# Patient Record
Sex: Male | Born: 1960 | Race: White | Hispanic: No | Marital: Married | Smoking: Never smoker
Health system: Southern US, Community
[De-identification: ages and names within clinical notes are randomized; demographics above are authoritative.]

## PROBLEM LIST (undated history)

## (undated) DIAGNOSIS — K219 Gastro-esophageal reflux disease without esophagitis: Secondary | ICD-10-CM

## (undated) DIAGNOSIS — G709 Myoneural disorder, unspecified: Secondary | ICD-10-CM

## (undated) DIAGNOSIS — G8929 Other chronic pain: Secondary | ICD-10-CM

## (undated) DIAGNOSIS — M792 Neuralgia and neuritis, unspecified: Secondary | ICD-10-CM

## (undated) DIAGNOSIS — I2510 Atherosclerotic heart disease of native coronary artery without angina pectoris: Secondary | ICD-10-CM

## (undated) DIAGNOSIS — M549 Dorsalgia, unspecified: Secondary | ICD-10-CM

## (undated) DIAGNOSIS — F419 Anxiety disorder, unspecified: Secondary | ICD-10-CM

## (undated) DIAGNOSIS — M1990 Unspecified osteoarthritis, unspecified site: Secondary | ICD-10-CM

## (undated) DIAGNOSIS — I10 Essential (primary) hypertension: Secondary | ICD-10-CM

## (undated) DIAGNOSIS — E7800 Pure hypercholesterolemia, unspecified: Secondary | ICD-10-CM

## (undated) HISTORY — PX: GANGLION CYST EXCISION: SHX1691

## (undated) HISTORY — PX: BACK SURGERY: SHX140

## (undated) HISTORY — PX: LUMBAR FUSION: SHX111

---

## 1998-09-01 ENCOUNTER — Ambulatory Visit (HOSPITAL_COMMUNITY): Admission: RE | Admit: 1998-09-01 | Discharge: 1998-09-01 | Payer: Self-pay

## 2003-12-19 ENCOUNTER — Ambulatory Visit: Payer: Self-pay | Admitting: Family Medicine

## 2003-12-23 ENCOUNTER — Ambulatory Visit: Payer: Self-pay | Admitting: Family Medicine

## 2004-03-29 ENCOUNTER — Ambulatory Visit: Payer: Self-pay | Admitting: Family Medicine

## 2004-06-01 ENCOUNTER — Ambulatory Visit: Payer: Self-pay | Admitting: Family Medicine

## 2004-08-11 ENCOUNTER — Ambulatory Visit: Payer: Self-pay | Admitting: Family Medicine

## 2004-08-11 ENCOUNTER — Ambulatory Visit (HOSPITAL_COMMUNITY): Admission: RE | Admit: 2004-08-11 | Discharge: 2004-08-11 | Payer: Self-pay | Admitting: Family Medicine

## 2004-08-24 ENCOUNTER — Encounter: Admission: RE | Admit: 2004-08-24 | Discharge: 2004-09-02 | Payer: Self-pay | Admitting: Family Medicine

## 2004-09-08 ENCOUNTER — Ambulatory Visit: Payer: Self-pay | Admitting: Family Medicine

## 2004-11-11 ENCOUNTER — Ambulatory Visit: Payer: Self-pay | Admitting: Family Medicine

## 2004-12-16 ENCOUNTER — Ambulatory Visit: Payer: Self-pay | Admitting: Family Medicine

## 2005-03-24 ENCOUNTER — Ambulatory Visit: Payer: Self-pay | Admitting: Family Medicine

## 2005-06-28 ENCOUNTER — Ambulatory Visit: Payer: Self-pay | Admitting: Family Medicine

## 2005-08-08 ENCOUNTER — Ambulatory Visit: Payer: Self-pay | Admitting: Family Medicine

## 2005-11-14 ENCOUNTER — Ambulatory Visit: Payer: Self-pay | Admitting: Family Medicine

## 2006-01-17 ENCOUNTER — Ambulatory Visit: Payer: Self-pay | Admitting: Family Medicine

## 2006-03-20 ENCOUNTER — Ambulatory Visit: Payer: Self-pay | Admitting: Family Medicine

## 2006-05-16 ENCOUNTER — Ambulatory Visit: Payer: Self-pay | Admitting: Family Medicine

## 2009-06-27 ENCOUNTER — Emergency Department (HOSPITAL_COMMUNITY): Admission: EM | Admit: 2009-06-27 | Discharge: 2009-06-27 | Payer: Self-pay | Admitting: Emergency Medicine

## 2009-06-29 ENCOUNTER — Emergency Department (HOSPITAL_COMMUNITY): Admission: EM | Admit: 2009-06-29 | Discharge: 2009-06-29 | Payer: Self-pay | Admitting: Emergency Medicine

## 2009-07-13 ENCOUNTER — Encounter: Admission: RE | Admit: 2009-07-13 | Discharge: 2009-07-13 | Payer: Self-pay | Admitting: Neurosurgery

## 2009-07-28 ENCOUNTER — Encounter: Admission: RE | Admit: 2009-07-28 | Discharge: 2009-09-04 | Payer: Self-pay | Admitting: Neurosurgery

## 2009-10-19 ENCOUNTER — Encounter
Admission: RE | Admit: 2009-10-19 | Discharge: 2009-10-19 | Payer: Self-pay | Source: Home / Self Care | Attending: Physical Medicine & Rehabilitation | Admitting: Physical Medicine & Rehabilitation

## 2010-04-19 LAB — GLUCOSE, CAPILLARY
Glucose-Capillary: 187 mg/dL — ABNORMAL HIGH (ref 70–99)
Glucose-Capillary: 246 mg/dL — ABNORMAL HIGH (ref 70–99)
Glucose-Capillary: 316 mg/dL — ABNORMAL HIGH (ref 70–99)

## 2010-04-19 LAB — URINALYSIS, ROUTINE W REFLEX MICROSCOPIC
Bilirubin Urine: NEGATIVE
Glucose, UA: >1000 mg/dL — AB
Leukocytes, UA: NEGATIVE
Nitrite: NEGATIVE
Specific Gravity, Urine: 1.03 (ref 1.005–1.030)
pH: 5.5 (ref 5.0–8.0)

## 2010-04-19 LAB — BASIC METABOLIC PANEL
BUN: 17 mg/dL (ref 6–23)
Chloride: 99 mEq/L (ref 96–112)
Glucose, Bld: 278 mg/dL — ABNORMAL HIGH (ref 70–99)
Potassium: 4 mEq/L (ref 3.5–5.1)
Sodium: 133 mEq/L — ABNORMAL LOW (ref 135–145)

## 2010-04-26 ENCOUNTER — Ambulatory Visit (INDEPENDENT_AMBULATORY_CARE_PROVIDER_SITE_OTHER): Payer: Self-pay | Admitting: Internal Medicine

## 2010-07-20 ENCOUNTER — Ambulatory Visit (INDEPENDENT_AMBULATORY_CARE_PROVIDER_SITE_OTHER): Payer: Medicare Other | Admitting: Internal Medicine

## 2010-07-20 DIAGNOSIS — Z7982 Long term (current) use of aspirin: Secondary | ICD-10-CM

## 2010-07-20 DIAGNOSIS — R1013 Epigastric pain: Secondary | ICD-10-CM

## 2010-07-20 DIAGNOSIS — R1011 Right upper quadrant pain: Secondary | ICD-10-CM

## 2010-08-10 NOTE — Consult Note (Signed)
Austin Pruitt              ACCOUNT NO.:  [id number]  MEDICAL RECORD NO.:  [id number]  LOCATION:                                 FACILITY:  PHYSICIAN:  Austin Pruitt, M.D.    DATE OF BIRTH:  May 28, 1960  DATE OF CONSULTATION:  07/20/2010 DATE OF DISCHARGE:                                CONSULTATION   REASON FOR CONSULTATION:  Abdominal pain, GERD.  HISTORY OF PRESENT ILLNESS:  Austin Pruitt is a 50 year old male referred to our office by Dr. Lysbeth Pruitt for right upper quadrant pain and nausea.  He tells me he has had this right upper quadrant pain for about a year. The pain comes and goes.  He also complains of frequent acid reflux on a daily basis.  He tells me when he lies down, he becomes queasy and then becomes nauseated.  Greasy foods will cause nausea and vomiting.  His symptoms will usually last about 3 hours.  He also complains of some epigastric pain.  He does have nausea at times after he eats.  He has undergone a HIDA scan which showed an 75% function.  He also had a negative ultrasound of the gallbladder.  He has been a diabetic since 1995.  He usually has 2 bowel movements a day.  No rectal bleeding or melena.  He denies weight loss, appetite changes, dysphagia.  There are no known allergies.  HOME MEDICATIONS: 1. Meloxicam 7.5 mg b.i.d. 2. Simvastatin 40 mg a day. 3. Metformin 500 mg 4 a day. 4. Januvia 100 mg a day. 5. Norvasc 10 mg a day. 6. Clonidine 0.1 mg a day. 7. Lisinopril 20 mg a day. 8. Glimepiride 4 mg a day. 9. Actos 30 mg in the morning. 10.Protonix 40 mg p.r.n. 11.Ambien 10 mg a day. 12.Fexofenadine 120 mg a day. 13.MVI 1 a day. 14.Fish oil 1200 mg 2 a day. 15.Aspirin 81 mg a day.  There are no known allergies.  SURGERIES:  He has had 3 lower back surgeries.  The last surgery was in 2000, and he also had a ganglion cyst removed from his left wrist.  MEDICAL HISTORY:  He has been hypertensive since 1995, diabetes type 2 since 1995, and a  history of arthritis.  FAMILY HISTORY:  His mother deceased, she underwent a CABG in the past and was a  diabetic type 2 with complications.  Father deceased from an MI.  Three sisters, two have diabetes and hypertension, one is in good health.  Three brothers, two have diabetes type 2, one is in good health.  SOCIAL HISTORY:  He is married.  He is disabled.  He does not smoke, drink, or do drugs, and he has 2 children in good health.  OBJECTIVE:  VITAL SIGNS:  His weight is 248, his height is 5 feet 11 inches, his temperature is 97.7, his blood pressure is 160/90, and his pulse is 72. HEENT:  His oral mucosa is moist.  His conjunctivae are pink.  His sclerae are anicteric. NECK:  His thyroid is normal.  There is no cervical lymphadenopathy. LUNGS:  Clear. HEART:  Regular rate and rhythm. ABDOMEN:  Soft.  Bowel sounds are positive.  No masses.  He does have some slight epigastric tenderness.  There is no right upper quadrant tenderness at this time.  ASSESSMENT:  Austin Pruitt is a 50 year old male with epigastricpain and frequent acid reflux.  He also has right upper quadrant pain.  His HIDA scan and ultrasound have been normal.  Peptic ulcer disease needs to be ruled out.  Also, the differential of this would be gastroparesis since he has been a diabetic for greater than 12 years.  Austin Pruitt's last colonoscopy was greater than 20 years ago.  He is now due for screening colonoscopy.  RECOMMENDATIONS:  We will schedule an EGD in the near future.  The risk and benefits were reviewed with him and he is agreeable.  If the EGD is normal, he may very well need a gastric emptying study.  He will take Protonix 40 mg 30 minutes before breakfast on a daily basis.  We will also schedule a screening colonoscopy.    ______________________________ Dorene Ar, NP   ______________________________ Austin Pruitt, M.D.    TS/MEDQ  D:  07/20/2010  T:  07/21/2010  Job:  [id number]  cc:   Austin Pruitt, M.D. Fax: [phone]  Electronically Signed by Dorene Ar PA on 07/22/2010 05:02:08 PM Electronically Signed by Austin Pruitt M.D. on 08/10/2010 09:04:43 AM

## 2010-08-31 MED ORDER — SODIUM CHLORIDE 0.45 % IV SOLN
Freq: Once | INTRAVENOUS | Status: AC
  Administered 2010-09-01: 12:00:00 via INTRAVENOUS

## 2010-09-01 ENCOUNTER — Encounter (HOSPITAL_COMMUNITY)
Admission: RE | Disposition: A | Discharge status: Home / Self Care | Payer: Self-pay | Source: Ambulatory Visit | Attending: Internal Medicine

## 2010-09-01 ENCOUNTER — Ambulatory Visit (HOSPITAL_COMMUNITY)
Admission: RE | Admit: 2010-09-01 | Discharge: 2010-09-01 | Disposition: A | Discharge status: Home / Self Care | Payer: Medicare Other | Source: Ambulatory Visit | Attending: Internal Medicine | Admitting: Internal Medicine

## 2010-09-01 ENCOUNTER — Encounter (HOSPITAL_COMMUNITY): Payer: Self-pay | Admitting: *Deleted

## 2010-09-01 ENCOUNTER — Encounter (INDEPENDENT_AMBULATORY_CARE_PROVIDER_SITE_OTHER): Payer: Medicare Other | Admitting: Internal Medicine

## 2010-09-01 DIAGNOSIS — R112 Nausea with vomiting, unspecified: Secondary | ICD-10-CM | POA: Insufficient documentation

## 2010-09-01 DIAGNOSIS — K259 Gastric ulcer, unspecified as acute or chronic, without hemorrhage or perforation: Secondary | ICD-10-CM | POA: Insufficient documentation

## 2010-09-01 DIAGNOSIS — K644 Residual hemorrhoidal skin tags: Secondary | ICD-10-CM | POA: Insufficient documentation

## 2010-09-01 DIAGNOSIS — Z1211 Encounter for screening for malignant neoplasm of colon: Secondary | ICD-10-CM

## 2010-09-01 DIAGNOSIS — R1013 Epigastric pain: Secondary | ICD-10-CM | POA: Insufficient documentation

## 2010-09-01 DIAGNOSIS — R1011 Right upper quadrant pain: Secondary | ICD-10-CM | POA: Insufficient documentation

## 2010-09-01 DIAGNOSIS — I10 Essential (primary) hypertension: Secondary | ICD-10-CM | POA: Insufficient documentation

## 2010-09-01 DIAGNOSIS — Z01812 Encounter for preprocedural laboratory examination: Secondary | ICD-10-CM | POA: Insufficient documentation

## 2010-09-01 DIAGNOSIS — E119 Type 2 diabetes mellitus without complications: Secondary | ICD-10-CM | POA: Insufficient documentation

## 2010-09-01 DIAGNOSIS — Z79899 Other long term (current) drug therapy: Secondary | ICD-10-CM | POA: Insufficient documentation

## 2010-09-01 DIAGNOSIS — Z7982 Long term (current) use of aspirin: Secondary | ICD-10-CM | POA: Insufficient documentation

## 2010-09-01 HISTORY — DX: Gastro-esophageal reflux disease without esophagitis: K21.9

## 2010-09-01 HISTORY — PX: COLONOSCOPY: SHX5424

## 2010-09-01 HISTORY — DX: Unspecified osteoarthritis, unspecified site: M19.90

## 2010-09-01 HISTORY — PX: ESOPHAGOGASTRODUODENOSCOPY: SHX5428

## 2010-09-01 SURGERY — EGD (ESOPHAGOGASTRODUODENOSCOPY)
Anesthesia: Moderate Sedation

## 2010-09-01 MED ORDER — PROMETHAZINE HCL 25 MG/ML IJ SOLN
INTRAMUSCULAR | Status: DC | PRN
  Administered 2010-09-01 (×2): 12.5 mg via INTRAVENOUS

## 2010-09-01 MED ORDER — STERILE WATER FOR IRRIGATION IR SOLN
Status: DC | PRN
  Administered 2010-09-01: 12:00:00

## 2010-09-01 MED ORDER — MEPERIDINE HCL 25 MG/ML IJ SOLN
INTRAMUSCULAR | Status: DC | PRN
  Administered 2010-09-01 (×2): 25 mg via INTRAVENOUS

## 2010-09-01 MED ORDER — PANTOPRAZOLE SODIUM 40 MG PO TBEC: 40.0000 mg | DELAYED_RELEASE_TABLET | Freq: Two times a day (BID) | ORAL | Status: AC

## 2010-09-01 MED ORDER — BUTAMBEN-TETRACAINE-BENZOCAINE 2-2-14 % EX AERO
INHALATION_SPRAY | CUTANEOUS | Status: DC | PRN
  Administered 2010-09-01: 2 via TOPICAL

## 2010-09-01 MED ORDER — MIDAZOLAM HCL 5 MG/5ML IJ SOLN
INTRAMUSCULAR | Status: DC | PRN
Start: 2010-09-01 — End: 2010-09-01
  Administered 2010-09-01 (×2): 3 mg via INTRAVENOUS
  Administered 2010-09-01 (×2): 2 mg via INTRAVENOUS

## 2010-09-01 NOTE — Brief Op Note (Signed)
Lab in to draw h pylori.  Message left at Dr Patty Sermons office to schedule xray.

## 2010-09-01 NOTE — Brief Op Note (Signed)
09/01/2010  1:04 PM  PATIENT:  Austin Pruitt  50 y.o. male  PRE-OPERATIVE DIAGNOSIS:  Epigastric & Right upper quad Abdominal pain, Screening  POST-OPERATIVE DIAGNOSIS:  gastric ulcer; gastric erosion, normal GE junction; limited/marginal colon prep right colon; external hemorrhoids  PROCEDURE:  Procedure(s): ESOPHAGOGASTRODUODENOSCOPY (EGD) COLONOSCOPY  SURGEON:  Surgeon(s): Malissa Hippo, MD  EGD note. Esophagus; normal mucosa and GE junction. Stomach; 7 mm triangular ulcer at gastric body along the posterior wall and single erosion next to it. Duodenum; normal bulbar and post bulbar mucosa. Colonoscopy note; Prep fair in the right colon; Normal terminal ileum. Normal clonic mucosa. Small external hemorrhoids.

## 2010-09-01 NOTE — H&P (Addendum)
Austin Pruitt is an 50 y.o. male.   Chief Complaint: Recurrent epigastric and right upper quadrant pain. Patient here for diagnostic EGD HPI: Patient is a 50 year old male with over a year history of intermittent pain epigastric region and right upper quadrant associated with frequent nausea and sporadic vomiting. He's undergone multiple studies including ultrasound and HIDA scan and they have all been negative. He denies hematemesis melena or rectal bleeding; he gives a remote history of peptic ulcer disease. He is on meloxicam chronically for chronic back pain and arthritis. He continues to experience intermittent heartburn despite taking medication.   Past Medical History  Diagnosis Date  . GERD (gastroesophageal reflux disease)   . Arthritis   . Diabetes mellitus     Past Surgical History  Procedure Date  . Back surgery     times 3  . Ganglion cyst excision   . Back surgery     History reviewed. No pertinent family history. Social History:  reports that he has never smoked. He does not have any smokeless tobacco history on file. He reports that he does not drink alcohol or use illicit drugs.  Allergies: Not on File  Medications Prior to Admission  Medication Dose Route Frequency Provider Last Rate Last Dose  . 0.45 % sodium chloride infusion   Intravenous Once Malissa Hippo, MD 20 mL/hr at 09/01/10 1201    . simethicone susp 15 ml in sterile water 1000 ml irrigation    PRN Malissa Hippo, MD       Medications Prior to Admission  Medication Sig Dispense Refill  . amLODipine (NORVASC) 10 MG tablet Take 10 mg by mouth daily.        Marland Kitchen aspirin 81 MG tablet Take 81 mg by mouth daily.        Marland Kitchen atorvastatin (LIPITOR) 40 MG tablet Take 40 mg by mouth daily.        . cloNIDine (CATAPRES) 0.1 MG tablet Take 0.1 mg by mouth 2 (two) times daily.        . fish oil-omega-3 fatty acids 1000 MG capsule Take 2 g by mouth daily.        Marland Kitchen glimepiride (AMARYL) 4 MG tablet Take 4 mg by  mouth daily before breakfast.        . HYDROcodone-acetaminophen (NORCO) 5-325 MG per tablet Take 1 tablet by mouth every 6 (six) hours as needed.        Marland Kitchen lisinopril (PRINIVIL,ZESTRIL) 20 MG tablet Take 20 mg by mouth daily.        . meloxicam (MOBIC) 7.5 MG tablet Take 7.5 mg by mouth daily.        . metFORMIN (GLUCOPHAGE-XR) 500 MG 24 hr tablet Take 500 mg by mouth daily with breakfast.        . Multiple Vitamins-Minerals (ANTIOXIDANT FORMULA SG) capsule Take 1 capsule by mouth daily.        . pantoprazole (PROTONIX) 40 MG tablet Take 40 mg by mouth daily.        . sitaGLIPtin (JANUVIA) 100 MG tablet Take 100 mg by mouth daily.        Marland Kitchen zolpidem (AMBIEN CR) 12.5 MG CR tablet Take 12.5 mg by mouth.          Results for orders placed during the hospital encounter of 09/01/10 (from the past 48 hour(s))  GLUCOSE, CAPILLARY     Status: Abnormal   Collection Time   09/01/10 11:43 AM      Component  Value Range Comment   Glucose-Capillary 154 (*) 70 - 99 (mg/dL)    No results found.  Review of Systems  Constitutional: Negative.   Gastrointestinal: Positive for nausea and abdominal pain. Negative for diarrhea, constipation, blood in stool and melena. Heartburn: intermittent despite taking protonix. Vomiting: occ vomiting.    Blood pressure 174/103, pulse 66, temperature 98.5 F (36.9 C), temperature source Oral, resp. rate 16, height 5\' 11"  (1.803 m), weight 248 lb (112.492 kg), SpO2 95.00%. Physical Exam  Constitutional: He appears well-developed and well-nourished.  HENT:  Mouth/Throat: Oropharynx is clear and moist.  Eyes: Conjunctivae are normal. No scleral icterus.  Neck: Neck supple. No thyromegaly present.  Cardiovascular: Normal rate, regular rhythm and normal heart sounds.   No murmur heard. Respiratory: Breath sounds normal.  GI: Soft. There is Tenderness: at midepigastric area and at RUQ.Marland Kitchen There is no rebound and no guarding.  Musculoskeletal: He exhibits no edema.    Lymphadenopathy:    He has no cervical adenopathy.  Neurological: He is alert.  Skin: Skin is warm and dry.     Assessment/Plan Recurrent epigastric and right upper quadrant pain with nausea and sporadic vomiting. Chronic GERD. Diagnostic EGD looking for peptic ulcer disease.  Salmaan Patchin U 09/01/2010, 12:15 PM   Patient will also undergo screening colonoscopy; these note that he will be 50 in 9 days.

## 2010-09-01 NOTE — Op Note (Signed)
NAME:  Austin Pruitt, Austin Pruitt              ACCOUNT NO.:  [id number]  MEDICAL RECORD NO.:  [id number]  LOCATION:  [facility]                          FACILITY:  [facility]  PHYSICIAN:  Lionel December, M.D.    DATE OF BIRTH:  [birth year]  DATE OF PROCEDURE:  09/01/2010 DATE OF DISCHARGE:  09/01/2010                              OPERATIVE REPORT   PROCEDURE:  Esophagogastroduodenoscopy followed by colonoscopy.  INDICATION:  Austin Pruitt is 50 year old Caucasian male with recurrent epigastric and right upper quadrant pain associated with nausea and sporadic vomiting spells.  He has chronic GERD and maintained on PPI and his heartburn is not well controlled.  The patient is on meloxicam daily and he also takes baby aspirin.  He has undergone ultrasound and HIDA scan and these studies were unremarkable.  He is undergoing diagnostic EGD followed by average risk screening colonoscopy.  Please note that he will be 50 years old in couple of days.  Procedure risks were reviewed with the patient.  Informed consent was obtained.  MEDS FOR CONSCIOUS SEDATION:  Cetacaine spray for oropharyngeal topical anesthesia, Demerol 50 mg IV, Versed 10 mg IV, promethazine 25 mg IV in diluted form.  FINDINGS:  Procedure performed in endoscopy suite.  The patient's vital signs and O2 sat were monitored during the procedure and remained stable.  PROCEDURE: 1. Esophagogastroduodenoscopy.  The patient was placed in left lateral     recumbent position and Pentax videoscope was passed through     oropharynx without any difficulty into esophagus. 2. Esophagus.  Mucosa of the esophagus was normal.  GE junction was     located at 40 cm from the incisors and was unremarkable. 3. Stomach.  It was empty and distended very well by insufflation.     Folds of proximal stomach are normal.  In the proximal stomach, few     centimeters distal to GE junction was a triangular ulcer about 7 mm     in maximal diameter and there was a linear erosion  next to it.  At     a clean base, there was prepyloric erythema, but no other lesions     were noted.  Pyloric channel was patent.  Angularis, fundus, and     cardia were examined by retroflexion of scope and no other     abnormalities were noted. 4. Duodenum.  Bulbar mucosa was normal.  Scope was passed into second     part of duodenal mucosa and folds were normal.  Endoscope was     withdrawn and the patient prepared for procedure #2. 5. Colonoscopy.  Rectal examination performed.  No abnormality noted     on external or digital exam.  Pentax videoscope was placed through     rectum and advanced under vision into sigmoid colon and beyond.     This preparation was satisfactory except he had pasty stool coating     mucosa of the ascending colon and cecum.  Therefore, examination of     this segment was not optimal.  Cecal landmarks are well seen and     pictures taken for the record.  Short segment of GI was also     examined and was  normal.  As the scope was withdrawn, colonic     mucosa was carefully examined.  No polyps, tumor masses, or     diverticular changes were noted.  Rectal mucosa was normal.  Scope     was retroflexed to examine anorectal junction and small hemorrhoids     noted below the dentate line.  Endoscope was withdrawn.  Withdrawal     time was 10 minutes.  The patient tolerated the procedure well.  FINAL DIAGNOSES: 1. Small ulcer at gastric body along the posterior wall most likely     secondary to nonsteroidal antiinflammatory drug therapy.  We will     rule out H. pylori infection. 2. Normal terminal ileum. 3. Normal colonoscopy except external hemorrhoids, however,     examination of the ascending colon was suboptimal because of     coating with pasty stool. 4. I am not convinced that this small gastric ulcer would explain all     of patient's symptoms, it might explain his midepigastric pain and     nausea.  I am still concerned that he could have biliary  tract     disease.  RECOMMENDATIONS: 1. Increase Protonix to 40 mg twice daily prescription written for 60     with 5 refills. 2. The patient advised to take meloxicam only when absolutely     necessary. 3. We will check his H. pylori serology today. 4. We will schedule him for upper abdominal ultrasound.          ______________________________ Lionel December, M.D.     NR/MEDQ  D:  09/01/2010  T:  09/01/2010  Job:  [id number]  cc:   Delaney Meigs, M.D. Fax: [phone]

## 2010-09-02 LAB — H. PYLORI ANTIBODY, IGG: H Pylori IgG: 0.5 {ISR}

## 2010-09-07 ENCOUNTER — Ambulatory Visit (HOSPITAL_COMMUNITY)
Admission: RE | Admit: 2010-09-07 | Discharge: 2010-09-07 | Disposition: A | Discharge status: Home / Self Care | Payer: Medicare Other | Source: Ambulatory Visit | Attending: Internal Medicine | Admitting: Internal Medicine

## 2010-09-07 DIAGNOSIS — K7689 Other specified diseases of liver: Secondary | ICD-10-CM | POA: Insufficient documentation

## 2010-09-07 DIAGNOSIS — R932 Abnormal findings on diagnostic imaging of liver and biliary tract: Secondary | ICD-10-CM | POA: Insufficient documentation

## 2010-09-07 DIAGNOSIS — R1011 Right upper quadrant pain: Secondary | ICD-10-CM | POA: Insufficient documentation

## 2010-09-08 ENCOUNTER — Encounter (HOSPITAL_COMMUNITY): Payer: Self-pay | Admitting: Internal Medicine

## 2010-09-08 ENCOUNTER — Other Ambulatory Visit (INDEPENDENT_AMBULATORY_CARE_PROVIDER_SITE_OTHER): Payer: Self-pay | Admitting: Internal Medicine

## 2010-09-08 DIAGNOSIS — R109 Unspecified abdominal pain: Secondary | ICD-10-CM

## 2010-10-05 NOTE — H&P (Signed)
NT[facility] SOAP Note  Vital Signs:  Vitals as of: 10/05/2010: Systolic 142: Diastolic 90: Heart Rate 75: Temp 98.63F: Height 76ft 11in: Weight 246Lbs 0 Ounces: Pain Level 6: BMI 34  BMI : 34.31 kg/m2  Subjective: This 50 Years 1 Months old Male presents for of ABDOMINAL PAIN : ,Has been having intermittent right upper quadrant abdominal pain, nausea, and fatty food intolerance for some time now. No fever, chills, jaundice. Workup by Dr. Karilyn Cota reveals cholelithiasis. HIDA scan reproduced his symptoms.  Review of Symptoms:  Constitutional: unremarkable  headaches Eyes:unremarkable  Nose/Mouth/Throat:unremarkable  Cardiovascular:unremarkable  Respiratory: unremarkable  Gastrointestin abdominal pain,nausea,vomiting,heartburn ?kidney stones joint pain Skin: unremarkable  Hematolgic/Lymphatic: unremarkable  Allergic/Immunologic:unremarkable     Past Medical History:Reviewed  Past Medical History  Surgical History: back surgery x 3 Medical Problems: Diabetes, High Blood pressure, high cholesterol Allergies: nkda Medications: atorvastatin, losartan, metformin, amlodipine, janumet, lisinopril, clonidine, meloxicam, glipuride, omeprazole, zolpidem, lorazepam, baby asa   Social History: Reviewed   Social History  Preferred Language: English ([place]) Race: White Ethnicity: Not Hispanic / Latino Age: 50 Years 1 Months Marital Status: M Alcohol: unknown Recreational drug(s): No   Smoking Status: Smoker/Current status unknown reviewed on 10/05/2010  Family History: Reviewed  Family History  Is there a family history of:dm, cad, htn   Objective Information:  General: Well appearing, well nourished in no distress.  Skin:no rash or prominent lesions  Head: Atraumatic; no masses; no abnormalities  Neck: Supple without lymphadenopathy.  Heart: RRR, no murmur or gallop. Normal S1, S2. No S3, S4.  Lungs:CTA bilaterally, no wheezes, rhonchi, rales. Breathing unlabored.    Abdomen: Soft, ND, normal bowel sounds, no HSM, no masses. No peritoneal signs. Tender in right upper quadrant to palpation.   Assessment: cholecystitis, cholelithiasis  Diagnosis & Procedure: DiagnosisCode: 574.10, ProcedureCode: 82956,   Orders:     Plan:Scheduled for laparoscopic cholecystectomy for 10/08/10.    Patient Education: Alternative treatments to surgery were discussed with patient (and family).Risks and benefits of procedure were fully explained to the patient (and family) who gave informed consent. Patient/family questions were addressed.  Follow-up: Pending Surgery

## 2010-10-06 NOTE — Patient Instructions (Signed)
20 Austin Pruitt  10/06/2010   Your procedure is scheduled on: 10/08/10  Report to [facility] at 0700 AM.  Call this number if you have problems the morning of surgery: [phone]   Remember:   Do not eat food:After Midnight.  Do not drink clear liquids: After Midnight.  Take these medicines the morning of surgery with A SIP OF WATER: amlodipine, catapres, lisinopril, cozaar, protonix. Do not take any diabetic medicines the morning of your procedure.   Do not wear jewelry, make-up or nail polish.  Do not wear lotions, powders, or perfumes. You may wear deodorant.  Do not shave 48 hours prior to surgery.  Do not bring valuables to the hospital.  Contacts, dentures or bridgework may not be worn into surgery.  Leave suitcase in the car. After surgery it may be brought to your room.  For patients admitted to the hospital, checkout time is 11:00 AM the day of discharge.   Patients discharged the day of surgery will not be allowed to drive home.  Name and phone number of your driver: family  Special Instructions: CHG Shower Use Special Wash: 1/2 bottle night before surgery and 1/2 bottle morning of surgery.   Please read over the following fact sheets that you were given: Coughing and Deep Breathing, MRSA Information, Surgical Site Infection Prevention, Anesthesia Post-op Instructions and Care and Recovery After Surgery   PATIENT INSTRUCTIONS POST-ANESTHESIA  IMMEDIATELY FOLLOWING SURGERY:  Do not drive or operate machinery for the first twenty four hours after surgery.  Do not make any important decisions for twenty four hours after surgery or while taking narcotic pain medications or sedatives.  If you develop intractable nausea and vomiting or a severe headache please notify your doctor immediately.  FOLLOW-UP:  Please make an appointment with your surgeon as instructed. You do not need to follow up with anesthesia unless specifically instructed to do so.  WOUND CARE INSTRUCTIONS (if  applicable):  Keep a dry clean dressing on the anesthesia/puncture wound site if there is drainage.  Once the wound has quit draining you may leave it open to air.  Generally you should leave the bandage intact for twenty four hours unless there is drainage.  If the epidural site drains for more than 36-48 hours please call the anesthesia department.  QUESTIONS?:  Please feel free to call your physician or the hospital operator if you have any questions, and they will be happy to assist you.     [facility] Anesthesia Department [address] [place] [phone]

## 2010-10-07 ENCOUNTER — Other Ambulatory Visit: Payer: Self-pay

## 2010-10-07 ENCOUNTER — Encounter (HOSPITAL_COMMUNITY)
Admission: RE | Admit: 2010-10-07 | Discharge: 2010-10-07 | Disposition: A | Discharge status: Home / Self Care | Payer: Medicare Other | Source: Ambulatory Visit | Attending: General Surgery | Admitting: General Surgery

## 2010-10-07 ENCOUNTER — Encounter (HOSPITAL_COMMUNITY): Payer: Self-pay

## 2010-10-07 HISTORY — DX: Atherosclerotic heart disease of native coronary artery without angina pectoris: I25.10

## 2010-10-07 HISTORY — DX: Pure hypercholesterolemia, unspecified: E78.00

## 2010-10-07 HISTORY — DX: Dorsalgia, unspecified: M54.9

## 2010-10-07 HISTORY — DX: Other chronic pain: G89.29

## 2010-10-07 HISTORY — DX: Myoneural disorder, unspecified: G70.9

## 2010-10-07 HISTORY — DX: Neuralgia and neuritis, unspecified: M79.2

## 2010-10-07 HISTORY — DX: Anxiety disorder, unspecified: F41.9

## 2010-10-07 HISTORY — DX: Essential (primary) hypertension: I10

## 2010-10-07 LAB — DIFFERENTIAL
Basophils Absolute: 0 10*3/uL (ref 0.0–0.1)
Basophils Relative: 0 % (ref 0–1)
Eosinophils Absolute: 0.1 10*3/uL (ref 0.0–0.7)
Monocytes Relative: 9 % (ref 3–12)
Neutro Abs: 4.4 10*3/uL (ref 1.7–7.7)
Neutrophils Relative %: 65 % (ref 43–77)

## 2010-10-07 LAB — CBC
Hemoglobin: 15.1 g/dL (ref 13.0–17.0)
MCH: 29.2 pg (ref 26.0–34.0)
MCHC: 35.5 g/dL (ref 30.0–36.0)
RDW: 13 % (ref 11.5–15.5)

## 2010-10-07 LAB — BASIC METABOLIC PANEL
BUN: 13 mg/dL (ref 6–23)
Calcium: 9.7 mg/dL (ref 8.4–10.5)
Creatinine, Ser: 0.61 mg/dL (ref 0.50–1.35)
GFR calc Af Amer: >60 mL/min (ref >60)
GFR calc non Af Amer: >60 mL/min (ref >60)
Glucose, Bld: 244 mg/dL — ABNORMAL HIGH (ref 70–99)
Potassium: 4.2 mEq/L (ref 3.5–5.1)

## 2010-10-07 LAB — HEPATIC FUNCTION PANEL
AST: 45 U/L — ABNORMAL HIGH (ref 0–37)
Albumin: 4.2 g/dL (ref 3.5–5.2)
Bilirubin, Direct: 0.2 mg/dL (ref 0.0–0.3)
Total Bilirubin: 0.6 mg/dL (ref 0.3–1.2)

## 2010-10-07 LAB — SURGICAL PCR SCREEN: MRSA, PCR: NEGATIVE

## 2010-10-08 ENCOUNTER — Encounter (HOSPITAL_COMMUNITY): Payer: Self-pay

## 2010-10-08 ENCOUNTER — Ambulatory Visit (HOSPITAL_COMMUNITY): Payer: Medicare Other | Admitting: Anesthesiology

## 2010-10-08 ENCOUNTER — Encounter (HOSPITAL_COMMUNITY): Payer: Self-pay | Admitting: Anesthesiology

## 2010-10-08 ENCOUNTER — Other Ambulatory Visit: Payer: Self-pay | Admitting: General Surgery

## 2010-10-08 ENCOUNTER — Encounter (HOSPITAL_COMMUNITY)
Admission: RE | Disposition: A | Discharge status: Home / Self Care | Payer: Self-pay | Source: Ambulatory Visit | Attending: General Surgery

## 2010-10-08 ENCOUNTER — Ambulatory Visit (HOSPITAL_COMMUNITY)
Admission: RE | Admit: 2010-10-08 | Discharge: 2010-10-08 | Disposition: A | Discharge status: Home / Self Care | Payer: Medicare Other | Source: Ambulatory Visit | Attending: General Surgery | Admitting: General Surgery

## 2010-10-08 DIAGNOSIS — K8010 Calculus of gallbladder with chronic cholecystitis without obstruction: Secondary | ICD-10-CM | POA: Insufficient documentation

## 2010-10-08 DIAGNOSIS — Z01812 Encounter for preprocedural laboratory examination: Secondary | ICD-10-CM | POA: Insufficient documentation

## 2010-10-08 DIAGNOSIS — Z79899 Other long term (current) drug therapy: Secondary | ICD-10-CM | POA: Insufficient documentation

## 2010-10-08 DIAGNOSIS — I10 Essential (primary) hypertension: Secondary | ICD-10-CM | POA: Insufficient documentation

## 2010-10-08 DIAGNOSIS — Z01810 Encounter for preprocedural cardiovascular examination: Secondary | ICD-10-CM | POA: Insufficient documentation

## 2010-10-08 DIAGNOSIS — Z7982 Long term (current) use of aspirin: Secondary | ICD-10-CM | POA: Insufficient documentation

## 2010-10-08 DIAGNOSIS — E119 Type 2 diabetes mellitus without complications: Secondary | ICD-10-CM | POA: Insufficient documentation

## 2010-10-08 HISTORY — PX: CHOLECYSTECTOMY: SHX55

## 2010-10-08 LAB — GLUCOSE, CAPILLARY: Glucose-Capillary: 287 mg/dL — ABNORMAL HIGH (ref 70–99)

## 2010-10-08 SURGERY — LAPAROSCOPIC CHOLECYSTECTOMY
Anesthesia: General | Wound class: Contaminated

## 2010-10-08 MED ORDER — ONDANSETRON HCL 4 MG/2ML IJ SOLN: 4.0000 mg | Freq: Once | INTRAMUSCULAR | Status: DC | PRN

## 2010-10-08 MED ORDER — ROCURONIUM BROMIDE 50 MG/5ML IV SOLN
INTRAVENOUS | Status: AC
  Filled 2010-10-08: qty 1

## 2010-10-08 MED ORDER — ACETAMINOPHEN 325 MG PO TABS: 325.0000 mg | ORAL_TABLET | ORAL | Status: DC | PRN

## 2010-10-08 MED ORDER — HEMOSTATIC AGENTS (NO CHARGE) OPTIME
TOPICAL | Status: DC | PRN
  Administered 2010-10-08: 2 via TOPICAL

## 2010-10-08 MED ORDER — ONDANSETRON HCL 4 MG/2ML IJ SOLN
INTRAMUSCULAR | Status: AC
  Filled 2010-10-08: qty 2

## 2010-10-08 MED ORDER — KETOROLAC TROMETHAMINE 30 MG/ML IJ SOLN
INTRAMUSCULAR | Status: AC
  Filled 2010-10-08: qty 1

## 2010-10-08 MED ORDER — ONDANSETRON HCL 4 MG/2ML IJ SOLN
4.0000 mg | Freq: Once | INTRAMUSCULAR | Status: AC
  Administered 2010-10-08: 4 mg via INTRAVENOUS

## 2010-10-08 MED ORDER — LACTATED RINGERS IV SOLN: INTRAVENOUS | Status: DC

## 2010-10-08 MED ORDER — SODIUM CHLORIDE 0.9 % IR SOLN
Status: DC | PRN
  Administered 2010-10-08: 1000 mL

## 2010-10-08 MED ORDER — NEOSTIGMINE METHYLSULFATE 1 MG/ML IJ SOLN
INTRAMUSCULAR | Status: AC
  Filled 2010-10-08: qty 10

## 2010-10-08 MED ORDER — ATROPINE SULFATE 0.4 MG/ML IJ SOLN
INTRAMUSCULAR | Status: DC | PRN
  Administered 2010-10-08: .2 mg via INTRAVENOUS

## 2010-10-08 MED ORDER — SUCCINYLCHOLINE CHLORIDE 20 MG/ML IJ SOLN
INTRAMUSCULAR | Status: DC | PRN
  Administered 2010-10-08: 120 mg via INTRAVENOUS

## 2010-10-08 MED ORDER — ENOXAPARIN SODIUM 40 MG/0.4ML ~~LOC~~ SOLN
40.0000 mg | Freq: Once | SUBCUTANEOUS | Status: AC
  Administered 2010-10-08: 40 mg via SUBCUTANEOUS

## 2010-10-08 MED ORDER — ROCURONIUM BROMIDE 100 MG/10ML IV SOLN
INTRAVENOUS | Status: DC | PRN
  Administered 2010-10-08: 25 mg via INTRAVENOUS
  Administered 2010-10-08: 10 mg via INTRAVENOUS
  Administered 2010-10-08: 5 mg via INTRAVENOUS

## 2010-10-08 MED ORDER — MIDAZOLAM HCL 2 MG/2ML IJ SOLN
INTRAMUSCULAR | Status: AC
  Filled 2010-10-08: qty 2

## 2010-10-08 MED ORDER — GLYCOPYRROLATE 0.2 MG/ML IJ SOLN
INTRAMUSCULAR | Status: AC
  Filled 2010-10-08: qty 1

## 2010-10-08 MED ORDER — FENTANYL CITRATE 0.05 MG/ML IJ SOLN
INTRAMUSCULAR | Status: AC
  Administered 2010-10-08: 50 ug via INTRAVENOUS
  Filled 2010-10-08: qty 5

## 2010-10-08 MED ORDER — LIDOCAINE HCL 1 % IJ SOLN
INTRAMUSCULAR | Status: DC | PRN
  Administered 2010-10-08: 10 mg via INTRADERMAL

## 2010-10-08 MED ORDER — SUCCINYLCHOLINE CHLORIDE 20 MG/ML IJ SOLN
INTRAMUSCULAR | Status: AC
  Filled 2010-10-08: qty 1

## 2010-10-08 MED ORDER — FENTANYL CITRATE 0.05 MG/ML IJ SOLN
INTRAMUSCULAR | Status: DC | PRN
  Administered 2010-10-08: 25 ug via INTRAVENOUS
  Administered 2010-10-08 (×2): 50 ug via INTRAVENOUS
  Administered 2010-10-08: 25 ug via INTRAVENOUS
  Administered 2010-10-08: 50 ug via INTRAVENOUS
  Administered 2010-10-08: 100 ug via INTRAVENOUS

## 2010-10-08 MED ORDER — ENOXAPARIN SODIUM 40 MG/0.4ML ~~LOC~~ SOLN
SUBCUTANEOUS | Status: AC
  Filled 2010-10-08: qty 0.4

## 2010-10-08 MED ORDER — BUPIVACAINE HCL 0.5 % IJ SOLN
INTRAMUSCULAR | Status: DC | PRN
  Administered 2010-10-08: 8 mL

## 2010-10-08 MED ORDER — FENTANYL CITRATE 0.05 MG/ML IJ SOLN
INTRAMUSCULAR | Status: AC
  Filled 2010-10-08: qty 2

## 2010-10-08 MED ORDER — PROPOFOL 10 MG/ML IV EMUL
INTRAVENOUS | Status: DC | PRN
  Administered 2010-10-08: 150 mg via INTRAVENOUS
  Administered 2010-10-08: 30 mg via INTRAVENOUS
  Administered 2010-10-08: 20 mg via INTRAVENOUS

## 2010-10-08 MED ORDER — BUPIVACAINE HCL (PF) 0.5 % IJ SOLN
INTRAMUSCULAR | Status: AC
  Filled 2010-10-08: qty 30

## 2010-10-08 MED ORDER — MIDAZOLAM HCL 2 MG/2ML IJ SOLN
1.0000 mg | INTRAMUSCULAR | Status: DC | PRN
  Administered 2010-10-08 (×2): 2 mg via INTRAVENOUS

## 2010-10-08 MED ORDER — PROPOFOL 10 MG/ML IV EMUL
INTRAVENOUS | Status: AC
  Filled 2010-10-08: qty 20

## 2010-10-08 MED ORDER — LACTATED RINGERS IV SOLN
INTRAVENOUS | Status: DC
  Administered 2010-10-08: 08:00:00 via INTRAVENOUS

## 2010-10-08 MED ORDER — ATROPINE SULFATE 0.4 MG/ML IJ SOLN
INTRAMUSCULAR | Status: AC
  Filled 2010-10-08: qty 1

## 2010-10-08 MED ORDER — HYDROCODONE-ACETAMINOPHEN 5-325 MG PO TABS: 1.0000 | ORAL_TABLET | ORAL | Status: AC | PRN

## 2010-10-08 MED ORDER — FENTANYL CITRATE 0.05 MG/ML IJ SOLN
25.0000 ug | INTRAMUSCULAR | Status: DC | PRN
  Administered 2010-10-08 (×4): 50 ug via INTRAVENOUS

## 2010-10-08 MED ORDER — KETOROLAC TROMETHAMINE 30 MG/ML IJ SOLN
30.0000 mg | Freq: Once | INTRAMUSCULAR | Status: AC
  Administered 2010-10-08: 30 mg via INTRAVENOUS

## 2010-10-08 MED ORDER — CEFAZOLIN SODIUM-DEXTROSE 2-3 GM-% IV SOLR
2.0000 g | INTRAVENOUS | Status: AC
  Administered 2010-10-08: 2 g via INTRAVENOUS

## 2010-10-08 MED ORDER — CEFAZOLIN SODIUM 1-5 GM-% IV SOLN
INTRAVENOUS | Status: AC
  Filled 2010-10-08: qty 50

## 2010-10-08 MED ORDER — GLYCOPYRROLATE 0.2 MG/ML IJ SOLN
INTRAMUSCULAR | Status: DC | PRN
  Administered 2010-10-08: .4 mg via INTRAVENOUS

## 2010-10-08 MED ORDER — NEOSTIGMINE METHYLSULFATE 1 MG/ML IJ SOLN
INTRAMUSCULAR | Status: DC | PRN
  Administered 2010-10-08: 2 mg via INTRAMUSCULAR

## 2010-10-08 MED ORDER — CEFAZOLIN SODIUM 1-5 GM-% IV SOLN
INTRAVENOUS | Status: AC
  Filled 2010-10-08: qty 100

## 2010-10-08 MED ORDER — GLYCOPYRROLATE 0.2 MG/ML IJ SOLN
0.2000 mg | Freq: Once | INTRAMUSCULAR | Status: AC | PRN
  Administered 2010-10-08: 0.2 mg via INTRAVENOUS

## 2010-10-08 SURGICAL SUPPLY — 36 items
APPLIER CLIP ROT 10 11.4 M/L (STAPLE) ×2
BAG HAMPER (MISCELLANEOUS) ×2 IMPLANT
CLIP APPLIE ROT 10 11.4 M/L (STAPLE) ×1 IMPLANT
CLOTH BEACON ORANGE TIMEOUT ST (SAFETY) ×2 IMPLANT
COVER LIGHT HANDLE STERIS (MISCELLANEOUS) ×4 IMPLANT
DECANTER SPIKE VIAL GLASS SM (MISCELLANEOUS) ×2 IMPLANT
DURAPREP 26ML APPLICATOR (WOUND CARE) ×2 IMPLANT
ELECT REM PT RETURN 9FT ADLT (ELECTROSURGICAL) ×2
ELECTRODE REM PT RTRN 9FT ADLT (ELECTROSURGICAL) ×1 IMPLANT
FILTER SMOKE EVAC LAPAROSHD (FILTER) ×2 IMPLANT
FORMALIN 10 PREFIL 120ML (MISCELLANEOUS) ×2 IMPLANT
GLOVE BIO SURGEON STRL SZ7.5 (GLOVE) ×2 IMPLANT
GLOVE BIOGEL PI IND STRL 8.5 (GLOVE) ×1 IMPLANT
GLOVE BIOGEL PI INDICATOR 8.5 (GLOVE) ×1
GLOVE ECLIPSE 7.0 STRL STRAW (GLOVE) ×2 IMPLANT
GLOVE ECLIPSE 8.0 STRL XLNG CF (GLOVE) ×2 IMPLANT
GLOVE ECLIPSE 8.5 STRL (GLOVE) ×2 IMPLANT
GLOVE INDICATOR 7.0 STRL GRN (GLOVE) ×2 IMPLANT
GOWN BRE IMP SLV AUR XL STRL (GOWN DISPOSABLE) ×4 IMPLANT
GOWN STRL REIN 3XL LVL4 (GOWN DISPOSABLE) ×2 IMPLANT
HEMOSTAT SNOW SURGICEL 2X4 (HEMOSTASIS) ×4 IMPLANT
INST SET LAPROSCOPIC AP (KITS) ×2 IMPLANT
KIT ROOM TURNOVER APOR (KITS) ×2 IMPLANT
KIT TROCAR LAP CHOLE (TROCAR) ×2 IMPLANT
MANIFOLD NEPTUNE II (INSTRUMENTS) ×2 IMPLANT
NS IRRIG 1000ML POUR BTL (IV SOLUTION) ×2 IMPLANT
PACK LAP CHOLE LZT030E (CUSTOM PROCEDURE TRAY) ×2 IMPLANT
PAD ARMBOARD 7.5X6 YLW CONV (MISCELLANEOUS) ×2 IMPLANT
POUCH SPECIMEN RETRIEVAL 10MM (ENDOMECHANICALS) ×2 IMPLANT
SET BASIN LINEN APH (SET/KITS/TRAYS/PACK) ×2 IMPLANT
SPONGE GAUZE 2X2 8PLY STRL LF (GAUZE/BANDAGES/DRESSINGS) ×4 IMPLANT
STAPLER VISISTAT (STAPLE) ×2 IMPLANT
SUT VICRYL 0 UR6 27IN ABS (SUTURE) ×2 IMPLANT
TAPE CLOTH SURG 4X10 WHT LF (GAUZE/BANDAGES/DRESSINGS) ×4 IMPLANT
WARMER LAPAROSCOPE (MISCELLANEOUS) ×2 IMPLANT
YANKAUER SUCT 12FT TUBE ARGYLE (SUCTIONS) ×2 IMPLANT

## 2010-10-08 NOTE — Anesthesia Postprocedure Evaluation (Signed)
Anesthesia Post Note  Patient: Austin Pruitt  Procedure(s) Performed:  LAPAROSCOPIC CHOLECYSTECTOMY  Anesthesia type: General  Patient location: PACU  Post pain: Pain level controlled  Post assessment: Post-op Vital signs reviewed, Patient's Cardiovascular Status Stable, Respiratory Function Stable, Patent Airway, No signs of Nausea or vomiting and Pain level controlled  Last Vitals:  Filed Vitals:   10/08/10 1127  BP: 122/71  Pulse: 69  Temp: 97.9 F (36.6 C)  Resp: 18    Post vital signs: Reviewed and stable  Level of consciousness: awake and alert   Complications: No apparent anesthesia complications  D/C home

## 2010-10-08 NOTE — Anesthesia Procedure Notes (Signed)
Procedure Name: Intubation Date/Time: 10/08/2010 9:24 AM Performed by: Minerva Areola Pre-anesthesia Checklist: Patient identified, Patient being monitored, Timeout performed, Emergency Drugs available and Suction available Patient Re-evaluated:Patient Re-evaluated prior to inductionOxygen Delivery Method: Circle System Utilized Preoxygenation: Pre-oxygenation with 100% oxygen Intubation Type: Rapid sequence and Circoid Pressure applied Laryngoscope Size: Miller and 2 Grade View: Grade I Tube type: Oral Tube size: 7.0 mm Number of attempts: 1 Airway Equipment and Method: stylet Placement Confirmation: ETT inserted through vocal cords under direct vision,  positive ETCO2 and breath sounds checked- equal and bilateral Secured at: 21 cm Tube secured with: Tape Dental Injury: Teeth and Oropharynx as per pre-operative assessment

## 2010-10-08 NOTE — Interval H&P Note (Signed)
History and Physical Interval Note:   10/08/2010   8:55 AM   Austin Pruitt  has presented today for surgery, with the diagnosis of Cholecystitis with cholelithiasis [574.10]  The various methods of treatment have been discussed with the patient and family. After consideration of risks, benefits and other options for treatment, the patient has consented to  Procedure(s): LAPAROSCOPIC CHOLECYSTECTOMY as a surgical intervention .  I have reviewed the patients' chart and labs.  Questions were answered to the patient's satisfaction.     Dalia Heading  MD

## 2010-10-08 NOTE — Transfer of Care (Signed)
Immediate Anesthesia Transfer of Care Note  Patient: Austin Pruitt  Procedure(s) Performed:  LAPAROSCOPIC CHOLECYSTECTOMY  Patient Location: PACU  Anesthesia Type: General  Level of Consciousness: awake  Airway & Oxygen Therapy: Patient Spontanous Breathing and non-rebreather face mask  Post-op Assessment: Report given to PACU RN, Post -op Vital signs reviewed and stable and Patient moving all extremities  Post vital signs: Reviewed and stable  Complications: No apparent anesthesia complications

## 2010-10-08 NOTE — Anesthesia Preprocedure Evaluation (Signed)
Anesthesia Evaluation  Name, MR# and DOB Patient awake  General Assessment Comment  Reviewed: Allergy & Precautions, H&P , NPO status , Patient's Chart, lab work & pertinent test results  Airway Mallampati: I TM Distance: >3 FB Neck ROM: Full    Dental No notable dental hx.    Pulmonary    pulmonary exam normalPulmonary Exam Normal     Cardiovascular hypertension, Pt. on medications + CAD Regular Normal    Neuro/Psych    (+) Anxiety,   Neuromuscular disease   GI/Hepatic/Renal        GERD Medicated and Controlled     Endo/Other  (+) Diabetes mellitus-, Well Controlled, Type 2, Oral Hypoglycemic Agents,     Abdominal Normal abdominal exam  (+)   Musculoskeletal  (+) Arthritis -, Osteoarthritis,    Hematology negative hematology ROS (+)   Peds  Reproductive/Obstetrics    Anesthesia Other Findings             Anesthesia Physical Anesthesia Plan  ASA: III  Anesthesia Plan: General   Post-op Pain Management:    Induction: Intravenous, Rapid sequence and Cricoid pressure planned  Airway Management Planned: Oral ETT  Additional Equipment:   Intra-op Plan:   Post-operative Plan: Extubation in OR  Informed Consent: I have reviewed the patients History and Physical, chart, labs and discussed the procedure including the risks, benefits and alternatives for the proposed anesthesia with the patient or authorized representative who has indicated his/her understanding and acceptance.     Plan Discussed with: CRNA  Anesthesia Plan Comments:         Anesthesia Quick Evaluation

## 2010-10-08 NOTE — Op Note (Signed)
Patient:  Austin Pruitt  DOB:  [birth year]  MRN:  [id number]   Preop Diagnosis:  Cholecystitis, cholelithiasis  Postop Diagnosis:  Same  Procedure:  Laparoscopic cholecystectomy  Surgeon:  Franky Macho, M.D.  Anes:  General endotracheal  Indications:  50 year old white male who presents with biliary colic secondary to cholelithiasis. Patient now presents for lap scopic cholecystectomy. Risks and benefits of the procedure including bleeding, infection, hepatobiliary injury, the possibly of an open procedure were fully explained to the patient, gave informed consent.  Procedure note:  Patient was placed in the supine position. After induction of general endotracheal anesthesia, the abdomen was prepped and draped using the usual sterile technique with DuraPrep. Surgical site confirmation was performed.  A supraumbilical incision was made down to the fascia. A Veress needle was introduced into the abdominal cavity and confirmation of placement was done using the saline drop test. The abdomen was then insufflated to 16 mm mercury pressure. An 11 mm trocar was introduced into the abdominal cavity under direct visualization without difficulty. Additional 11 mm trocar was placed the epigastric region 5 mm trochars were placed the right the quadrant right flank regions. The liver was inspected and noted to be somewhat enlarged, consistent with a fatty liver. The gallbladder was thin-walled and retracted superior laterally. Dissection was begun around the infundibulum the gallbladder. The cystic duct was first identified. Junction to the infundibulum flow identified. Endoclips placed proximally and distally on the cystic duct and cystic duct was divided. This likewise done cystic artery. The gallbladder was then freed away from the gallbladder fossa using Bovie electrocautery. There was some spillage of bile during the removal gallbladder. The gallbladder was placed in an Endo Catch bag and removed from  the abdominal cavity without difficulty. Sent to pathology for further examination. Any bleeding was controlled using Bovie electrocautery. All fluid and air were then evacuated from the abdominal cavity prior to removal of the trochars.  All wounds were gave normal saline. All wounds were injected with 0.5% Sensorcaine. The supraumbilical fascia as well as epigastric fascia reapproximated using 0 Vicryl interrupted sutures. All skin incisions were closed using staples. Betadine ointment dry sterile dressings were applied.  All tape and needle counts were correct at the end of the procedure. The patient was extubated in the operating room went back to PACU in stable condition.   Complications:  None   EBL:  Minimal  Specimen:  Gallbladder

## 2010-10-14 ENCOUNTER — Encounter (HOSPITAL_COMMUNITY): Payer: Self-pay | Admitting: General Surgery

## 2010-12-25 IMAGING — CR DG ORBITS FOR FOREIGN BODY
2 series · 2 of 2 positions shown · non-contrast
Comparison: None.

CLINICAL DATA: Pre MRI/history of metal exposure

ORBITS FOR FOREIGN BODY - 2 VIEW

[view not recorded (1 of 2)]
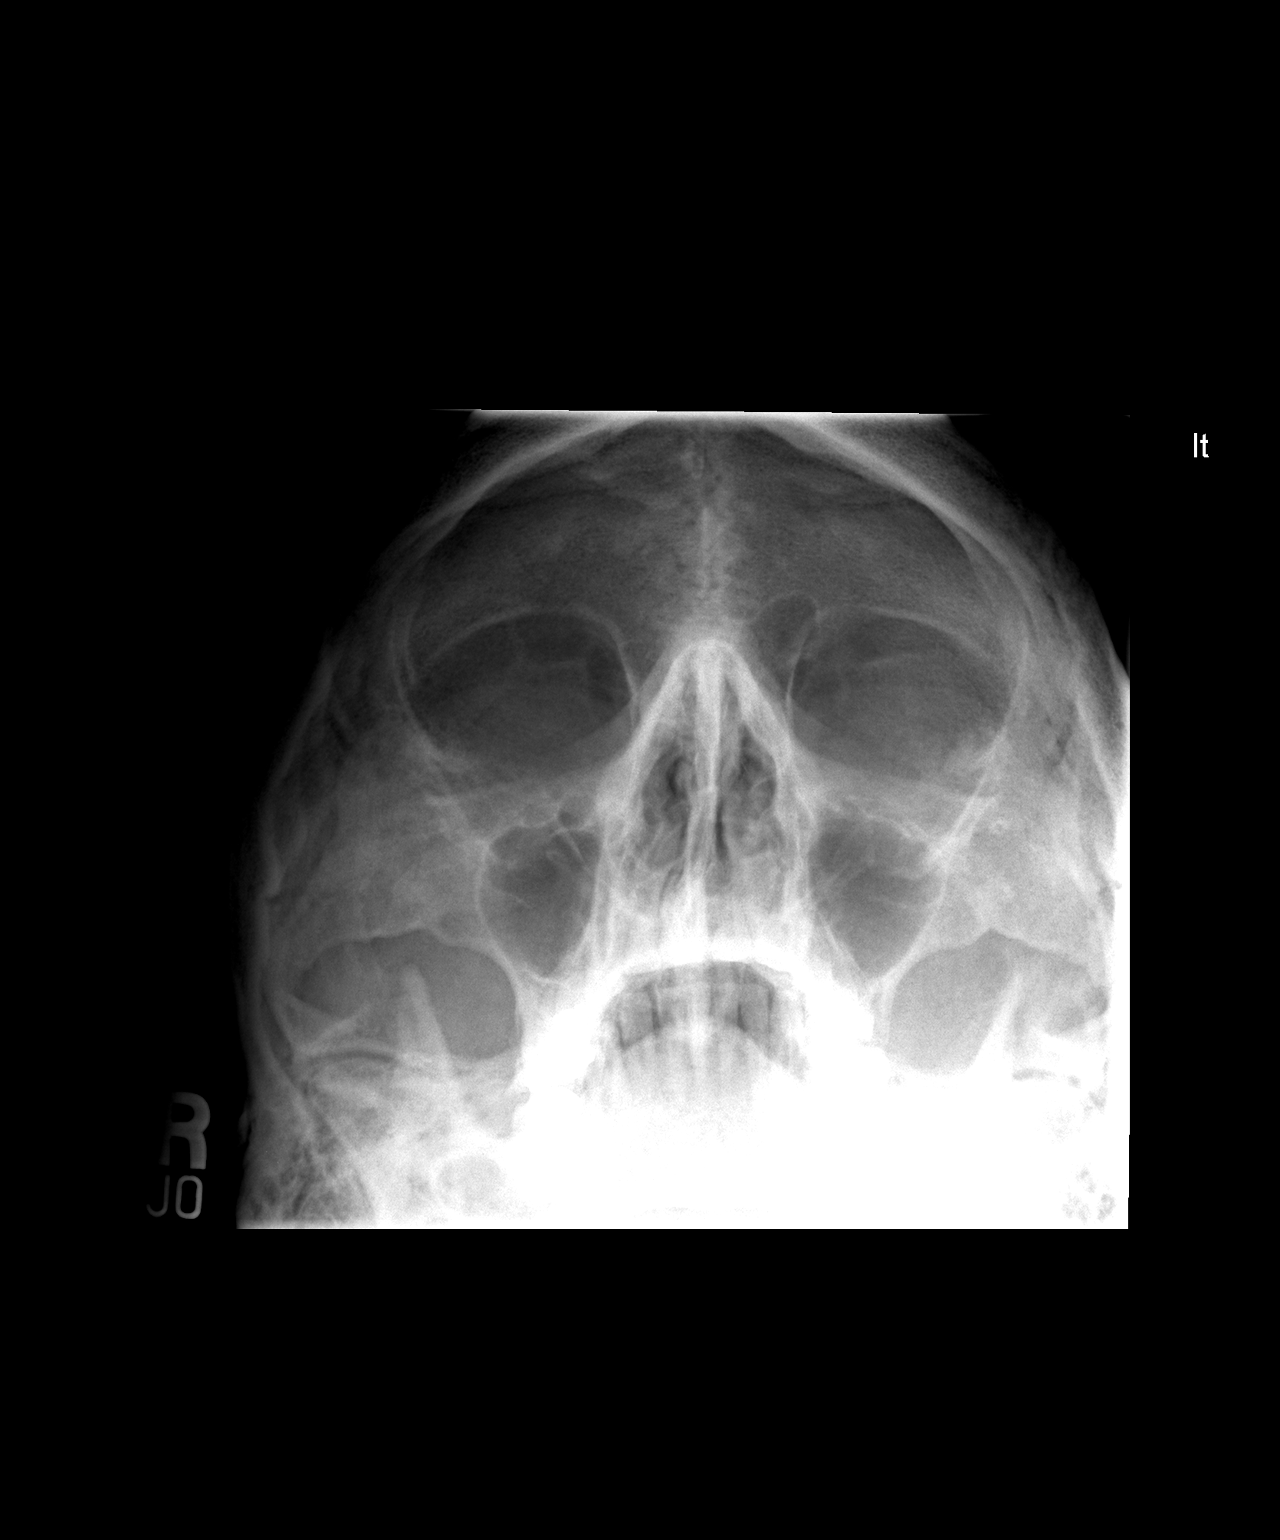

[view not recorded (2 of 2)]
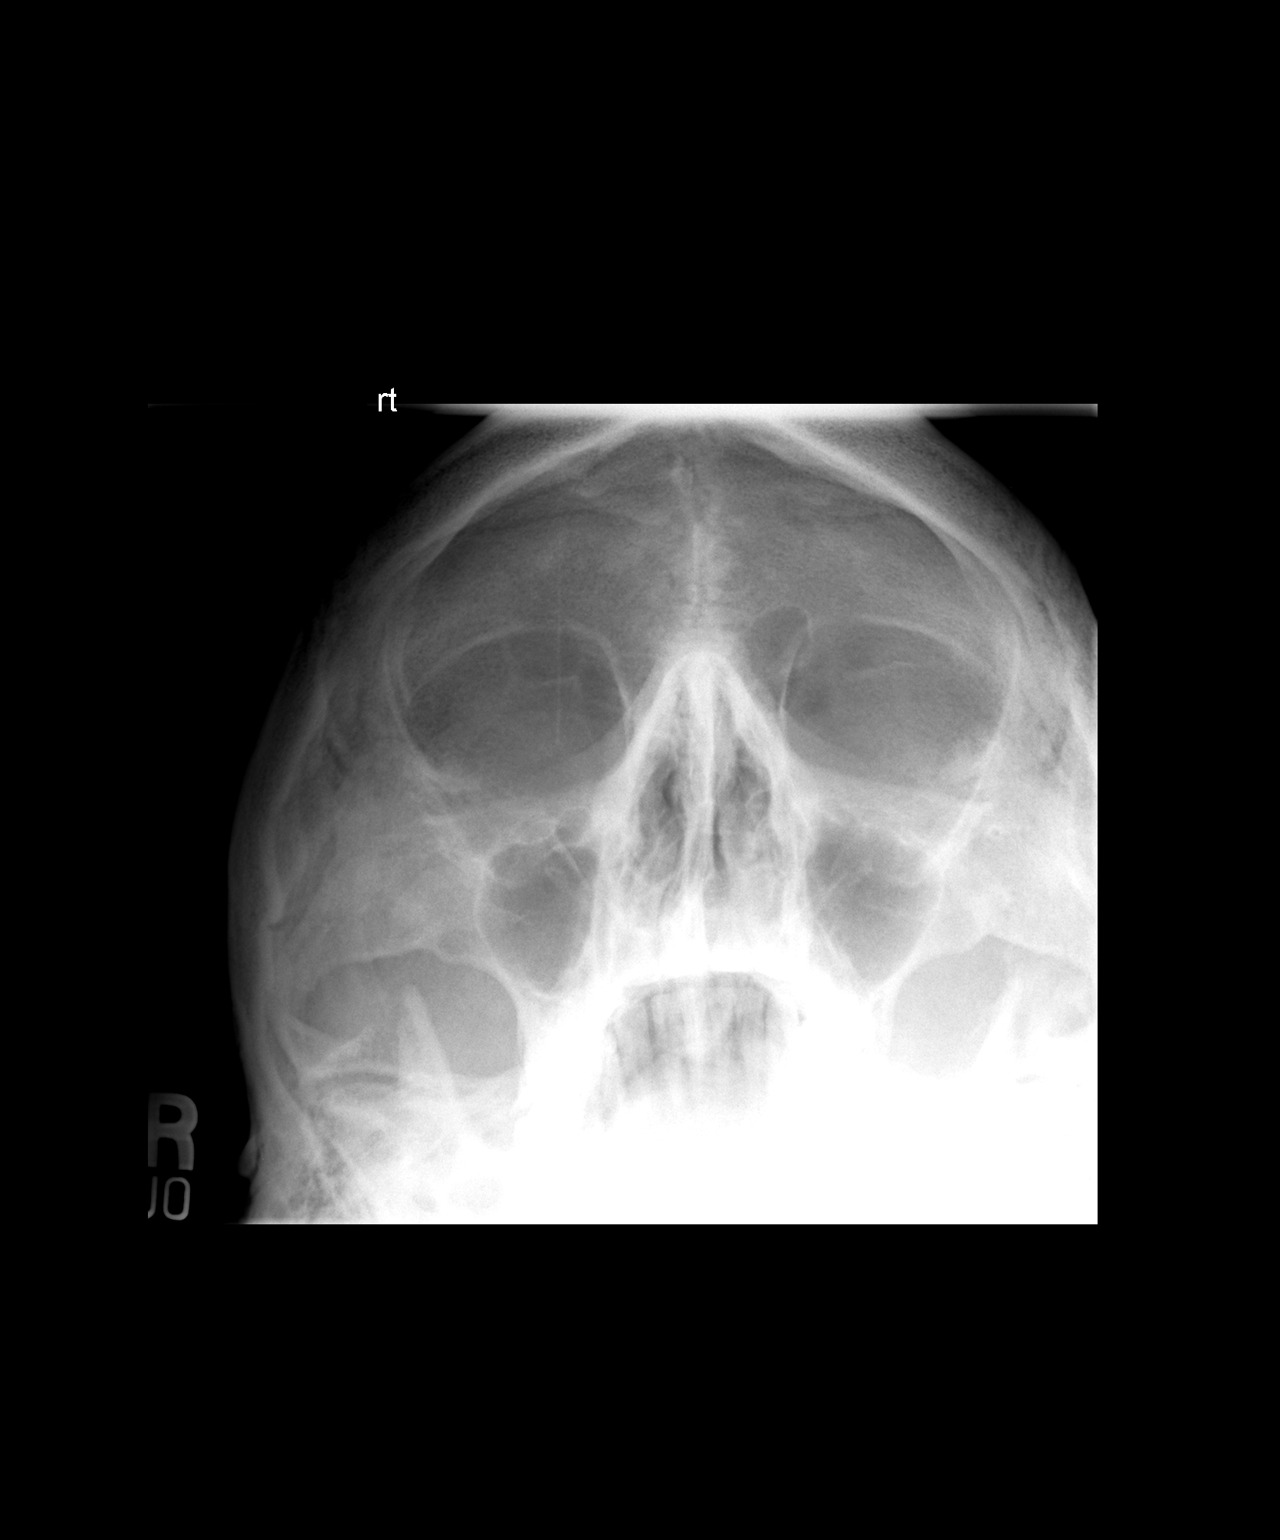

[2 of 2 positions shown; findings below may reference images not displayed]

FINDINGS: No radiopaque foreign body projects over either orbit. No
other pathological
IMPRESSION: No evidence of metallic density foreign body to preclude MRI
scanning.

## 2015-11-27 LAB — BASIC METABOLIC PANEL: Glucose: 232 mg/dL

## 2015-12-19 LAB — BASIC METABOLIC PANEL: GLUCOSE: 122 mg/dL

## 2016-01-13 NOTE — Congregational Nurse Program (Unsigned)
Congregational Nurse Program Note  Date of Encounter: 01/13/2016  Past Medical History: Past Medical History:  Diagnosis Date  . Anxiety   . Arthritis   . Chronic back pain   . Coronary artery disease   . Diabetes mellitus   . GERD (gastroesophageal reflux disease)   . Hypercholesteremia   . Hypertension   . Neuromuscular disorder (HCC)    peripheral neuropathy  . Neuropathic pain of lower extremity     Encounter Details:     CNP Questionnaire - 12/19/15 1142      Patient Demographics   Is this a new or existing patient? New   Patient is considered a/an Not Applicable   Race Caucasian/White     Patient Assistance   Location of Patient Assistance [place]   Patient's financial/insurance status Private Insurance Coverage   Uninsured Patient ([facility]/Care Connects) No   Patient referred to apply for the following financial assistance Not Applicable   Food insecurities addressed Provided food supplies   Transportation assistance No   Assistance securing medications No   Educational health offerings Diabetes;Hypertension;Nutrition     Encounter Details   Primary purpose of visit Education/Health Concerns;Navigating the Healthcare System   Was an Emergency Department visit averted? No   Does patient have a medical provider? Yes   Patient referred to Establish PCP   Was a mental health screening completed? (GAINS tool) No   Does patient have dental issues? No   Does patient have vision issues? No   Does your patient have an abnormal blood pressure today? Yes   Since previous encounter, have you referred patient for abnormal blood pressure that resulted in a new diagnosis or medication change? Yes   Does your patient have an abnormal blood glucose today? No   Since previous encounter, have you referred patient for abnormal blood glucose that resulted in a new diagnosis or medication change? No   Was there a life-saving intervention made? No     [place]  [facility] [place], BP and Nutrition facts.  Monitors blood glucose at home.  Takes insulin daily  Complaints of tingling across right side of face, plans to see PCP  Taking Gabapentin BP 145/70 Pulse [phone] Cecilie KicksLeanna Lawson, [place] [phone]

## 2016-02-18 NOTE — Congregational Nurse Program (Unsigned)
Congregational Nurse Program Note  Date of Encounter: 02/18/2016  Past Medical History: Past Medical History:  Diagnosis Date  . Anxiety   . Arthritis   . Chronic back pain   . Coronary artery disease   . Diabetes mellitus   . GERD (gastroesophageal reflux disease)   . Hypercholesteremia   . Hypertension   . Neuromuscular disorder (HCC)    peripheral neuropathy  . Neuropathic pain of lower extremity     Encounter Details:     CNP Questionnaire - 02/04/16 1142      Patient Demographics   Is this a new or existing patient? Existing   Patient is considered a/an Not Applicable   Race Caucasian/White     Patient Assistance   Location of Patient Assistance [place]   Patient's financial/insurance status Medicare   Uninsured Patient ([facility] Card/Care Connects) No   Patient referred to apply for the following financial assistance Not Applicable   Food insecurities addressed Provided food supplies   Transportation assistance No   Assistance securing medications No   Educational health offerings Diabetes;Safety;Other     Encounter Details   Primary purpose of visit Acute Illness/Condition Visit;Safety   Was an Emergency Department visit averted? Yes   Does patient have a medical provider? Yes   Patient referred to Follow up with established PCP   Was a mental health screening completed? (GAINS tool) No   Does patient have dental issues? No   Does patient have vision issues? No   Does your patient have an abnormal blood pressure today? Yes   Since previous encounter, have you referred patient for abnormal blood pressure that resulted in a new diagnosis or medication change? No   Does your patient have an abnormal blood glucose today? Yes   Since previous encounter, have you referred patient for abnormal blood glucose that resulted in a new diagnosis or medication change? No   Was there a life-saving intervention made? Yes    C/O hard red area on(L) lower abdomen.  Gives himself 5 Insulin injection daily.  Advised to check with his doctor to insure no infection is in area and advised to rotate injction sites. B/P 130/75 Pulse 87, Blood Sugar 201.Benson Setting/.Dev Dhondt,RN [phone]  02/05/2016 Follow up visit/. saw PCP on 02/04/16  Was put on antibiotics regarding the hard red area on (L.) lower abd. Cecilie KicksLeanna Marvette Schamp, RN [phone]

## 2016-04-27 NOTE — Congregational Nurse Program (Signed)
Congregational Nurse Program Note  Date of Encounter: 04/27/2016  Past Medical History: Past Medical History:  Diagnosis Date  . Anxiety   . Arthritis   . Chronic back pain   . Coronary artery disease   . Diabetes mellitus   . GERD (gastroesophageal reflux disease)   . Hypercholesteremia   . Hypertension   . Neuromuscular disorder (HCC)    peripheral neuropathy  . Neuropathic pain of lower extremity     Encounter Details:     CNP Questionnaire - 04/01/16 1110      Patient Demographics   Is this a new or existing patient? Existing   Patient is considered a/an Not Applicable   Race Caucasian/White     Patient Assistance   Location of Patient Assistance [place]   Patient's financial/insurance status Private Insurance Coverage   Uninsured Patient ([facility]/Care Connects) No   Patient referred to apply for the following financial assistance Not Applicable   Food insecurities addressed Provided food supplies   Transportation assistance No   Assistance securing medications No   Educational health offerings Diabetes;Hypertension;Nutrition     Encounter Details   Primary purpose of visit Education/Health Concerns   Was an Emergency Department visit averted? Not Applicable   Does patient have a medical provider? Yes   Patient referred to Not Applicable   Was a mental health screening completed? (GAINS tool) No   Does patient have dental issues? No   Does your patient have an abnormal blood pressure today? No   Since previous encounter, have you referred patient for abnormal blood pressure that resulted in a new diagnosis or medication change? No   Does your patient have an abnormal blood glucose today? Yes   Since previous encounter, have you referred patient for abnormal blood glucose that resulted in a new diagnosis or medication change? No   Was there a life-saving intervention made? No     04/01/16  B/P 127/80 Pulse 68, Blood Sugar 167 coming  down;Instructed to continue to rotate Insulin administration sites. Benson SettingLeanna Makyia Erxleben,RN [phone] 04/22/16 Nausea and Vomiting, says he ate too much. Got weak.Elder Cyphers. Ginger-ale with Ice chips given, relaxed felt better, BS 132.BP 132/83 Pulse 72. Cecilie KicksLeanna Javeion Cannedy, RN [phone]. Did take his Insulin this AM.

## 2016-05-24 NOTE — Congregational Nurse Program (Unsigned)
Congregational Nurse Program Note  Date of Encounter: 05/24/2016  Past Medical History: Past Medical History:  Diagnosis Date  . Anxiety   . Arthritis   . Chronic back pain   . Coronary artery disease   . Diabetes mellitus   . GERD (gastroesophageal reflux disease)   . Hypercholesteremia   . Hypertension   . Neuromuscular disorder (HCC)    peripheral neuropathy  . Neuropathic pain of lower extremity     Encounter Details:     CNP Questionnaire - 05/24/16 0138      Patient Demographics   Is this a new or existing patient? Existing   Patient is considered a/an Not Applicable   Race Caucasian/White     Patient Assistance   Location of Patient Assistance [place]   Patient's financial/insurance status Medicare   Uninsured Patient ([facility]/Care Connects) No   Patient referred to apply for the following financial assistance Not Applicable   Food insecurities addressed Provided food supplies   Transportation assistance No   Assistance securing medications No     Encounter Details   Primary purpose of visit Education/Health Concerns;Navigating the Healthcare System;Safety   Was an Emergency Department visit averted? No   Does patient have a medical provider? Yes   Patient referred to Follow up with established PCP   Was a mental health screening completed? (GAINS tool) No   Does patient have dental issues? No   Does patient have vision issues? No   Does your patient have an abnormal blood pressure today? No   Since previous encounter, have you referred patient for abnormal blood pressure that resulted in a new diagnosis or medication change? No   Does your patient have an abnormal blood glucose today? No   Since previous encounter, have you referred patient for abnormal blood glucose that resulted in a new diagnosis or medication change? No   Was there a life-saving intervention made? No    05/20/16  12:30pm  Check (R) rib area, hurt on lawn mower(leaning on  arm rest)  Breath sounds clear, slight ly tender to touch, ,no visible swelling)continue with warm soaks and ice pack. See PCP if problem with breathing, to R/O fx. Rib.  B/P 133/86, BS 120. Cecilie Kicks, RN [phone].

## 2016-07-27 NOTE — Congregational Nurse Program (Signed)
Congregational Nurse Program Note  Date of Encounter: 07/26/2016  Past Medical History: Past Medical History:  Diagnosis Date  . Anxiety   . Arthritis   . Chronic back pain   . Coronary artery disease   . Diabetes mellitus   . GERD (gastroesophageal reflux disease)   . Hypercholesteremia   . Hypertension   . Neuromuscular disorder (HCC)    peripheral neuropathy  . Neuropathic pain of lower extremity     Encounter   B/P 117/73 Pulse 71 . Has some lower back pain.  Ribs bruised are not as sore.     Cecilie KicksLeanna Jini Horiuchi, RN [phone].

## 2016-09-21 NOTE — Congregational Nurse Program (Signed)
Congregational Nurse Program Note  Date of Encounter: 09/21/2016  Past Medical History: Past Medical History:  Diagnosis Date  . Anxiety   . Arthritis   . Chronic back pain   . Coronary artery disease   . Diabetes mellitus   . GERD (gastroesophageal reflux disease)   . Hypercholesteremia   . Hypertension   . Neuromuscular disorder (HCC)    peripheral neuropathy  . Neuropathic pain of lower extremity     Encounter Details:     CNP Questionnaire - 08/26/16 1100      Patient Demographics   Is this a new or existing patient? Existing   Patient is considered a/an Not Applicable   Race Caucasian/White     Patient Assistance   Location of Patient Assistance [place]   Patient's financial/insurance status Medicare   Patient referred to apply for the following financial assistance Not Applicable   Food insecurities addressed Provided food supplies   Transportation assistance No   Assistance securing medications No   Educational health offerings Acute disease;Diabetes     Encounter Details   Primary purpose of visit Education/Health Concerns   Was an Emergency Department visit averted? Not Applicable   Does patient have a medical provider? Yes   Patient referred to Follow up with established PCP   Was a mental health screening completed? (GAINS tool) No   Does patient have dental issues? No   Does patient have vision issues? No   Does your patient have an abnormal blood pressure today? No   Since previous encounter, have you referred patient for abnormal blood pressure that resulted in a new diagnosis or medication change? No   Does your patient have an abnormal blood glucose today? Yes  Blood Sugar elevated   Was there a life-saving intervention made? No    Check B/P and   Blood sugar 148. L.Hart [place] , RN [phone]

## 2016-09-29 NOTE — Congregational Nurse Program (Signed)
Congregational Nurse Program Note  Date of Encounter: 09/29/2016  Past Medical History: Past Medical History:  Diagnosis Date  . Anxiety   . Arthritis   . Chronic back pain   . Coronary artery disease   . Diabetes mellitus   . GERD (gastroesophageal reflux disease)   . Hypercholesteremia   . Hypertension   . Neuromuscular disorder (HCC)    peripheral neuropathy  . Neuropathic pain of lower extremity     Encounter Details:     CNP Questionnaire - 09/29/16 1030      Patient Demographics   Is this a new or existing patient? Existing   Patient is considered a/an Not Applicable   Race Caucasian/White     Patient Assistance   Location of Patient Assistance [place] [id number]   Patient's financial/insurance status Medicare;Medicaid   Uninsured Patient ([place]/Care Connects) No   Patient referred to apply for the following financial assistance Not Applicable   Food insecurities addressed Provided food supplies   Transportation assistance No   Assistance securing medications No   Educational health offerings Diabetes;Nutrition;Safety;Hypertension     Encounter Details   Primary purpose of visit Education/Health Concerns;Safety   Was an Emergency Department visit averted? Not Applicable   Does patient have a medical provider? Yes   Patient referred to Not Applicable   Was a mental health screening completed? (GAINS tool) No   Does patient have dental issues? No   Does patient have vision issues? No   Does your patient have an abnormal blood pressure today? No   Since previous encounter, have you referred patient for abnormal blood pressure that resulted in a new diagnosis or medication change? No   Does your patient have an abnormal blood glucose today? Yes  Bl. Sugar 110   Since previous encounter, have you referred patient for abnormal blood glucose that resulted in a new diagnosis or medication change? No  Takes meds. for his Diabetes   Was there a life-saving intervention  made? No    On 26 meds.  Advised to revisit with his PCP to check medications. Having N&V past eating and taking meds. Blood Sugar this AM 110. Cecilie KicksLeanna Lawson, RN [phone]

## 2016-11-27 ENCOUNTER — Emergency Department (HOSPITAL_COMMUNITY)
Admission: EM | Admit: 2016-11-27 | Discharge: 2016-11-27 | Disposition: A | Discharge status: Home / Self Care | Payer: Medicare Other | Attending: Emergency Medicine | Admitting: Emergency Medicine

## 2016-11-27 ENCOUNTER — Encounter (HOSPITAL_COMMUNITY): Payer: Self-pay | Admitting: Emergency Medicine

## 2016-11-27 DIAGNOSIS — Z79899 Other long term (current) drug therapy: Secondary | ICD-10-CM | POA: Diagnosis not present

## 2016-11-27 DIAGNOSIS — L989 Disorder of the skin and subcutaneous tissue, unspecified: Secondary | ICD-10-CM | POA: Diagnosis present

## 2016-11-27 DIAGNOSIS — L02211 Cutaneous abscess of abdominal wall: Secondary | ICD-10-CM

## 2016-11-27 DIAGNOSIS — I2510 Atherosclerotic heart disease of native coronary artery without angina pectoris: Secondary | ICD-10-CM | POA: Insufficient documentation

## 2016-11-27 DIAGNOSIS — I10 Essential (primary) hypertension: Secondary | ICD-10-CM | POA: Diagnosis not present

## 2016-11-27 DIAGNOSIS — E119 Type 2 diabetes mellitus without complications: Secondary | ICD-10-CM | POA: Insufficient documentation

## 2016-11-27 DIAGNOSIS — Z7984 Long term (current) use of oral hypoglycemic drugs: Secondary | ICD-10-CM | POA: Diagnosis not present

## 2016-11-27 LAB — BASIC METABOLIC PANEL
Anion gap: 11 (ref 5–15)
BUN: 27 mg/dL — ABNORMAL HIGH (ref 6–20)
CO2: 26 mmol/L (ref 22–32)
Calcium: 9.1 mg/dL (ref 8.9–10.3)
Chloride: 98 mmol/L — ABNORMAL LOW (ref 101–111)
Creatinine, Ser: 1.4 mg/dL — ABNORMAL HIGH (ref 0.61–1.24)
GFR calc Af Amer: >60 mL/min (ref >60)
GFR calc non Af Amer: 55 mL/min — ABNORMAL LOW (ref >60)
Glucose, Bld: 135 mg/dL — ABNORMAL HIGH (ref 65–99)
Potassium: 3.7 mmol/L (ref 3.5–5.1)
Sodium: 135 mmol/L (ref 135–145)

## 2016-11-27 LAB — CBC WITH DIFFERENTIAL/PLATELET
Basophils Absolute: 0 10*3/uL (ref 0.0–0.1)
Basophils Relative: 0 %
Eosinophils Absolute: 0.2 10*3/uL (ref 0.0–0.7)
Eosinophils Relative: 2 %
HCT: 36.3 % — ABNORMAL LOW (ref 39.0–52.0)
Hemoglobin: 11.9 g/dL — ABNORMAL LOW (ref 13.0–17.0)
Lymphocytes Relative: 23 %
Lymphs Abs: 2.3 10*3/uL (ref 0.7–4.0)
MCH: 26.7 pg (ref 26.0–34.0)
MCHC: 32.8 g/dL (ref 30.0–36.0)
MCV: 81.4 fL (ref 78.0–100.0)
Monocytes Absolute: 1.2 10*3/uL — ABNORMAL HIGH (ref 0.1–1.0)
Monocytes Relative: 12 %
Neutro Abs: 6.3 10*3/uL (ref 1.7–7.7)
Neutrophils Relative %: 63 %
Platelets: 254 10*3/uL (ref 150–400)
RBC: 4.46 MIL/uL (ref 4.22–5.81)
RDW: 13.6 % (ref 11.5–15.5)
WBC: 10 10*3/uL (ref 4.0–10.5)

## 2016-11-27 MED ORDER — HYDROMORPHONE HCL 1 MG/ML IJ SOLN
1.0000 mg | Freq: Once | INTRAMUSCULAR | Status: AC
  Administered 2016-11-27: 1 mg via INTRAVENOUS
  Filled 2016-11-27: qty 1

## 2016-11-27 MED ORDER — LORAZEPAM 2 MG/ML IJ SOLN
1.0000 mg | Freq: Once | INTRAMUSCULAR | Status: AC
  Administered 2016-11-27: 1 mg via INTRAVENOUS
  Filled 2016-11-27: qty 1

## 2016-11-27 MED ORDER — VANCOMYCIN HCL IN DEXTROSE 1-5 GM/200ML-% IV SOLN
1000.0000 mg | INTRAVENOUS | Status: AC
  Administered 2016-11-27 (×2): 1000 mg via INTRAVENOUS
  Filled 2016-11-27 (×2): qty 200

## 2016-11-27 MED ORDER — BUPIVACAINE HCL (PF) 0.25 % IJ SOLN
10.0000 mL | Freq: Once | INTRAMUSCULAR | Status: AC
  Administered 2016-11-27: 10 mL
  Filled 2016-11-27: qty 30

## 2016-11-27 MED ORDER — KETOROLAC TROMETHAMINE 30 MG/ML IJ SOLN
15.0000 mg | Freq: Once | INTRAMUSCULAR | Status: AC
  Administered 2016-11-27: 15 mg via INTRAVENOUS
  Filled 2016-11-27: qty 1

## 2016-11-27 MED ORDER — SULFAMETHOXAZOLE-TRIMETHOPRIM 800-160 MG PO TABS: 1.0000 | ORAL_TABLET | Freq: Two times a day (BID) | ORAL | 0 refills | Status: AC

## 2016-11-27 NOTE — ED Notes (Signed)
Pt alert & oriented x4, stable gait. Patient given discharge instructions, paperwork & prescription(s). Patient  instructed to stop at the registration desk to finish any additional paperwork. Patient verbalized understanding. Pt left department w/ no further questions. 

## 2016-11-27 NOTE — ED Provider Notes (Signed)
[facility] EMERGENCY DEPARTMENT Provider Note   CSN: [id number] Arrival date & time: 11/27/16  1719     History   Chief Complaint Chief Complaint  Patient presents with  . Cellulitis    HPI [place] is a 56 y.o. male.  HPI   56 year old male with a large painful lesion to his right abdomen.  He noticed at the site of an insulin injection about 2 weeks ago.  Initially a small "knot."  Progressively worsening since then.  No drainage.  No fevers or chills.  Past Medical History:  Diagnosis Date  . Anxiety   . Arthritis   . Chronic back pain   . Coronary artery disease   . Diabetes mellitus   . GERD (gastroesophageal reflux disease)   . Hypercholesteremia   . Hypertension   . Neuromuscular disorder (HCC)    peripheral neuropathy  . Neuropathic pain of lower extremity     There are no active problems to display for this patient.   Past Surgical History:  Procedure Laterality Date  . BACK SURGERY     times 3  . BACK SURGERY    . CHOLECYSTECTOMY  10/08/2010   Procedure: LAPAROSCOPIC CHOLECYSTECTOMY;  Surgeon: Dalia HeadingMark A Jenkins;  Location: [facility];  Service: General;  Laterality: N/A;  . COLONOSCOPY  09/01/2010   Procedure: COLONOSCOPY;  Surgeon: Malissa HippoNajeeb U Rehman, MD;  Location: [facility];  Service: Endoscopy;  Laterality: N/A;  . ESOPHAGOGASTRODUODENOSCOPY  09/01/2010   Procedure: ESOPHAGOGASTRODUODENOSCOPY (EGD);  Surgeon: Malissa HippoNajeeb U Rehman, MD;  Location: [facility];  Service: Endoscopy;  Laterality: N/A;  . GANGLION CYST EXCISION  90's   left wrist- [place]  . LUMBAR FUSION     had one in 5898 and 2000       Home Medications    Prior to Admission medications   Medication Sig Start Date End Date Taking? Authorizing Provider  amLODipine (NORVASC) 10 MG tablet Take 10 mg by mouth daily.      [provider]  aspirin 81 MG tablet Take 81 mg by mouth daily.      [provider]  atorvastatin (LIPITOR) 40 MG tablet Take 40 mg by mouth  daily.      [provider]  cloNIDine (CATAPRES) 0.1 MG tablet Take 0.1 mg by mouth 2 (two) times daily.      [provider]  fish oil-omega-3 fatty acids 1000 MG capsule Take 2 g by mouth daily.      [provider]  glimepiride (AMARYL) 4 MG tablet Take 4 mg by mouth daily before breakfast.      [provider]  HYDROcodone-acetaminophen (NORCO) 5-325 MG per tablet Take 1-2 tablets by mouth every 4 (four) hours as needed for pain. For pain 10/08/10   Franky MachoJenkins, Mark, MD  lisinopril (PRINIVIL,ZESTRIL) 20 MG tablet Take 20 mg by mouth daily.      [provider]  losartan (COZAAR) 50 MG tablet Take 50 mg by mouth daily.      [provider]  meloxicam (MOBIC) 7.5 MG tablet Take 7.5 mg by mouth daily.   08/31/10   [provider]  metFORMIN (GLUCOPHAGE-XR) 500 MG 24 hr tablet Take 500 mg by mouth daily with breakfast.      [provider]  Multiple Vitamins-Minerals (ANTIOXIDANT FORMULA SG) capsule Take 1 capsule by mouth daily.      [provider]  pantoprazole (PROTONIX) 40 MG tablet Take 1 tablet (40 mg total) by  mouth 2 (two) times daily. 09/01/10   Rehman, Joline Maxcy, MD  sitaGLIPtin (JANUVIA) 100 MG tablet Take 100 mg by mouth daily.      [provider]  zolpidem (AMBIEN CR) 12.5 MG CR tablet Take 12.5 mg by mouth at bedtime as needed. For sleep    [provider]    Family History Family History  Problem Relation Age of Onset  . Anesthesia problems Neg Hx   . Hypotension Neg Hx   . Malignant hyperthermia Neg Hx   . Pseudochol deficiency Neg Hx     Social History Social History  Substance Use Topics  . Smoking status: Never Smoker  . Smokeless tobacco: Never Used  . Alcohol use No     Allergies   Patient has no known allergies.   Review of Systems Review of Systems  All systems reviewed and negative, other than as noted in HPI. Physical Exam Updated Vital Signs BP (!) 146/75    Pulse 83   Temp 98.2 F (36.8 C) (Oral)   Resp 16   Ht 5\' 11"  (1.803 m)   Wt 102.1 kg (225 lb)   SpO2 94%   BMI 31.38 kg/m   Physical Exam  Constitutional: He appears well-developed and well-nourished. No distress.  HENT:  Head: Normocephalic and atraumatic.  Eyes: Conjunctivae are normal. Right eye exhibits no discharge. Left eye exhibits no discharge.  Neck: Neck supple.  Cardiovascular: Normal rate, regular rhythm and normal heart sounds. Exam reveals no gallop and no friction rub.  No murmur heard. Pulmonary/Chest: Effort normal and breath sounds normal. No respiratory distress.  Abdominal: Soft. He exhibits no distension. There is no tenderness.  Musculoskeletal: He exhibits no edema or tenderness.  Neurological: He is alert.  Skin: Skin is warm and dry.  Large abscess to right anterior abdominal wall with surrounding cellulitis.  Abscess itself is approximately 4 x 5 cm.  Bedside ultrasound does confirm a loculated collection.  Psychiatric: He has a normal mood and affect. His behavior is normal. Thought content normal.  Nursing note and vitals reviewed.    ED Treatments / Results  Labs (all labs ordered are listed, but only abnormal results are displayed) Labs Reviewed  CBC WITH DIFFERENTIAL/PLATELET  BASIC METABOLIC PANEL    EKG  EKG Interpretation None       Radiology No results found.  Procedures Procedures (including critical care time)  INCISION AND DRAINAGE Performed by: Raeford Razor Consent: Verbal consent obtained. Risks and benefits: risks, benefits and alternatives were discussed Type: abscess  Body area: abdomen  Anesthesia: local infiltration  Incision was made with a scalpel.  Local anesthetic: lidocaine 1%  Anesthetic total: 5 ml  Complexity: complex Blunt dissection to break up loculations  Drainage: purulent  Drainage amount: Large.  Purulent.  Packing material: 1 in iodoform gauze  Patient tolerance: Patient  tolerated the procedure well with no immediate complications.     Medications Ordered in ED Medications  bupivacaine (PF) (MARCAINE) 0.25 % injection 10 mL (not administered)  HYDROmorphone (DILAUDID) injection 1 mg (not administered)  ketorolac (TORADOL) 30 MG/ML injection 15 mg (not administered)  LORazepam (ATIVAN) injection 1 mg (not administered)  vancomycin (VANCOCIN) 2,000 mg in sodium chloride 0.9 % 500 mL IVPB (not administered)     Initial Impression / Assessment and Plan / ED Course  I have reviewed the triage vital signs and the nursing notes.  Pertinent labs & imaging results that were available during my care of the patient were  reviewed by me and considered in my medical decision making (see chart for details).     56 year old male with a very large abscess to his right abdominal wall, but I think this is amenable to bedside drainage.  He has no systemic symptoms.  Given the size of this, and some mild surrounding cellulitis, will place him on antibiotics.  Advised that I like him to be reevaluated in a couple days.  Emergent return precautions sooner were discussed.  Final Clinical Impressions(s) / ED Diagnoses   Final diagnoses:  Abscess of abdominal wall    New Prescriptions New Prescriptions   No medications on file     Raeford Razor, MD 12/13/16 [phone]

## 2016-11-27 NOTE — ED Triage Notes (Signed)
Cellulitis to right side of abdomen, pt give himself insulin shot in belly. Pt noticed a knot 2 weeks ago, talk to PCP and was told it was ok, called them the next week to tell them is was painful and larger, they was going to call in antibiotic, but pharmacy never received. Large, red, warm and painful area to right side of belly

## 2017-04-14 LAB — GLUCOSE, POCT (MANUAL RESULT ENTRY): POC GLUCOSE: 152 mg/dL — AB (ref 70–99)

## 2017-05-26 ENCOUNTER — Other Ambulatory Visit: Payer: Self-pay

## 2017-05-26 ENCOUNTER — Emergency Department (HOSPITAL_COMMUNITY): Payer: Medicare HMO

## 2017-05-26 ENCOUNTER — Emergency Department (HOSPITAL_COMMUNITY)
Admission: EM | Admit: 2017-05-26 | Discharge: 2017-05-27 | Disposition: A | Discharge status: Home / Self Care | Payer: Medicare HMO | Attending: Emergency Medicine | Admitting: Emergency Medicine

## 2017-05-26 ENCOUNTER — Encounter (HOSPITAL_COMMUNITY): Payer: Self-pay | Admitting: *Deleted

## 2017-05-26 DIAGNOSIS — S46201A Unspecified injury of muscle, fascia and tendon of other parts of biceps, right arm, initial encounter: Secondary | ICD-10-CM | POA: Insufficient documentation

## 2017-05-26 DIAGNOSIS — E119 Type 2 diabetes mellitus without complications: Secondary | ICD-10-CM | POA: Diagnosis not present

## 2017-05-26 DIAGNOSIS — Y9389 Activity, other specified: Secondary | ICD-10-CM | POA: Diagnosis not present

## 2017-05-26 DIAGNOSIS — Z7984 Long term (current) use of oral hypoglycemic drugs: Secondary | ICD-10-CM | POA: Diagnosis not present

## 2017-05-26 DIAGNOSIS — W208XXA Other cause of strike by thrown, projected or falling object, initial encounter: Secondary | ICD-10-CM | POA: Diagnosis not present

## 2017-05-26 DIAGNOSIS — Y9289 Other specified places as the place of occurrence of the external cause: Secondary | ICD-10-CM | POA: Insufficient documentation

## 2017-05-26 DIAGNOSIS — S46209A Unspecified injury of muscle, fascia and tendon of other parts of biceps, unspecified arm, initial encounter: Secondary | ICD-10-CM

## 2017-05-26 DIAGNOSIS — I10 Essential (primary) hypertension: Secondary | ICD-10-CM | POA: Insufficient documentation

## 2017-05-26 DIAGNOSIS — M542 Cervicalgia: Secondary | ICD-10-CM | POA: Diagnosis present

## 2017-05-26 DIAGNOSIS — Z7982 Long term (current) use of aspirin: Secondary | ICD-10-CM | POA: Diagnosis not present

## 2017-05-26 DIAGNOSIS — Y999 Unspecified external cause status: Secondary | ICD-10-CM | POA: Insufficient documentation

## 2017-05-26 DIAGNOSIS — Z79899 Other long term (current) drug therapy: Secondary | ICD-10-CM | POA: Insufficient documentation

## 2017-05-26 DIAGNOSIS — M436 Torticollis: Secondary | ICD-10-CM | POA: Diagnosis not present

## 2017-05-26 MED ORDER — HYDROCODONE-ACETAMINOPHEN 5-325 MG PO TABS
2.0000 | ORAL_TABLET | Freq: Once | ORAL | Status: AC
  Administered 2017-05-26: 2 via ORAL
  Filled 2017-05-26: qty 2

## 2017-05-26 NOTE — ED Notes (Signed)
Pt reports he does not work as he is disabled  He was helping move heavy equipment on a cart when it began to fall off Weds  Neck pain since then   R bicep is swollen and discolored

## 2017-05-26 NOTE — ED Triage Notes (Signed)
Pt c/o neck pain that radiates down to shoulders, has hx of bulging disc to neck area, states that he went to pick up something that was falling and tried to grab the object a few days ago, states that his neck pain became worse,

## 2017-05-26 NOTE — ED Notes (Signed)
JM in to assess 

## 2017-05-26 NOTE — ED Notes (Signed)
[place] [address]

## 2017-05-26 NOTE — ED Notes (Signed)
Request for rad order

## 2017-05-27 MED ORDER — DICLOFENAC SODIUM 1 % TD GEL: 2.0000 g | Freq: Four times a day (QID) | TRANSDERMAL | 0 refills | Status: AC

## 2017-05-27 MED ORDER — MENTHOL (TOPICAL ANALGESIC) 4 % EX GEL: 1.0000 "application " | Freq: Three times a day (TID) | CUTANEOUS | 0 refills | Status: AC | PRN

## 2017-05-27 MED ORDER — METHOCARBAMOL 500 MG PO TABS: 500.0000 mg | ORAL_TABLET | Freq: Every evening | ORAL | 0 refills | Status: AC | PRN

## 2017-05-27 NOTE — Discharge Instructions (Signed)
As discussed, your CT neck did not show any acute abnormalities only chronic degenerative changes.  Your symptoms are consistent with a muscle injury and muscle spasm.  Muscle relaxers can be helpful with types of symptoms in addition to massage, heat, and muscle creams.  Make sure that you do not drive or operate machinery while you take muscle relaxants.  Try Robaxin in place of baclofen and see if you feel any improvement.  Do not take both.  Follow-up with orthopedics regarding your right bicep tendon injury and your primary care provider regarding your neck pain. Return if symptoms worsen, new weakness or numbness or other new concerning symptoms in the meantime.

## 2017-05-27 NOTE — ED Provider Notes (Addendum)
[facility] EMERGENCY DEPARTMENT Provider Note   CSN: [id number] Arrival date & time: 05/26/17  2007     History   Chief Complaint Chief Complaint  Patient presents with  . Neck Pain    HPI JEOFFREY Pruitt is a 57 y.o. male with past medical history significant for chronic neck and back pain currently seen in pain clinic, anxiety, arthritis, diabetes, cad, LE neuropathy presenting with acute on chronic neck pain after attempting to catch a heavy piece of equipment falling down causing him to fall onto his right side injuring his right upper arm as well 2 days ago.  He is mainly concerned with his neck but notes that his right biceps looks different than his left with bulging deformity. Late last night he started experiencing tightness and feeling like he could not fully move his neck from side to side.  He denies any new numbness or tingling in the upper extremities.  His pain is along the trapezius muscle bilaterally and sternocleidomastoid but does report midline pain which he is unclear if it is at baseline or not.  He denies any direct fall or trauma to the neck.  He is currently on a regimen of baclofen, naproxen, and narcotics as well as neuropathic pain management and has taken some of his home medications without relief.   HPI  Past Medical History:  Diagnosis Date  . Anxiety   . Arthritis   . Chronic back pain   . Coronary artery disease   . Diabetes mellitus   . GERD (gastroesophageal reflux disease)   . Hypercholesteremia   . Hypertension   . Neuromuscular disorder (HCC)    peripheral neuropathy  . Neuropathic pain of lower extremity     There are no active problems to display for this patient.   Past Surgical History:  Procedure Laterality Date  . BACK SURGERY     times 3  . BACK SURGERY    . CHOLECYSTECTOMY  10/08/2010   Procedure: LAPAROSCOPIC CHOLECYSTECTOMY;  Surgeon: Dalia Heading;  Location: [facility];  Service: General;  Laterality: N/A;  . COLONOSCOPY   09/01/2010   Procedure: COLONOSCOPY;  Surgeon: Malissa Hippo, MD;  Location: [facility];  Service: Endoscopy;  Laterality: N/A;  . ESOPHAGOGASTRODUODENOSCOPY  09/01/2010   Procedure: ESOPHAGOGASTRODUODENOSCOPY (EGD);  Surgeon: Malissa Hippo, MD;  Location: [facility];  Service: Endoscopy;  Laterality: N/A;  . GANGLION CYST EXCISION  90's   left wrist- [place]  . LUMBAR FUSION     had one in 34 and 2000        Home Medications    Prior to Admission medications   Medication Sig Start Date End Date Taking? Authorizing Provider  amLODipine (NORVASC) 10 MG tablet Take 10 mg by mouth daily.      [provider]  aspirin 81 MG tablet Take 81 mg by mouth daily.      [provider]  atorvastatin (LIPITOR) 40 MG tablet Take 40 mg by mouth daily.      [provider]  cloNIDine (CATAPRES) 0.1 MG tablet Take 0.1 mg by mouth 2 (two) times daily.      [provider]  diclofenac sodium (VOLTAREN) 1 % GEL Apply 2 g topically 4 (four) times daily. 05/27/17   Mathews Robinsons B, PA-C  fish oil-omega-3 fatty acids 1000 MG capsule Take 2 g by mouth daily.      [provider]  glimepiride (AMARYL) 4 MG tablet Take 4 mg  by mouth daily before breakfast.      [provider]  HYDROcodone-acetaminophen (NORCO) 5-325 MG per tablet Take 1-2 tablets by mouth every 4 (four) hours as needed for pain. For pain 10/08/10   Franky Macho, MD  lisinopril (PRINIVIL,ZESTRIL) 20 MG tablet Take 20 mg by mouth daily.      [provider]  losartan (COZAAR) 50 MG tablet Take 50 mg by mouth daily.      [provider]  meloxicam (MOBIC) 7.5 MG tablet Take 7.5 mg by mouth daily.   08/31/10   [provider]  Menthol, Topical Analgesic, (BIOFREEZE ROLL-ON) 4 % GEL Apply 1 application topically 3 (three) times daily as needed. 05/27/17   Georgiana Shore, PA-C  metFORMIN (GLUCOPHAGE-XR) 500 MG 24 hr tablet Take 500 mg by mouth daily with  breakfast.      [provider]  methocarbamol (ROBAXIN) 500 MG tablet Take 1 tablet (500 mg total) by mouth at bedtime as needed. 05/27/17   Georgiana Shore, PA-C  Multiple Vitamins-Minerals (ANTIOXIDANT FORMULA SG) capsule Take 1 capsule by mouth daily.      [provider]  pantoprazole (PROTONIX) 40 MG tablet Take 1 tablet (40 mg total) by mouth 2 (two) times daily. 09/01/10   Rehman, Joline Maxcy, MD  sitaGLIPtin (JANUVIA) 100 MG tablet Take 100 mg by mouth daily.      [provider]  zolpidem (AMBIEN CR) 12.5 MG CR tablet Take 12.5 mg by mouth at bedtime as needed. For sleep    [provider]    Family History Family History  Problem Relation Age of Onset  . Anesthesia problems Neg Hx   . Hypotension Neg Hx   . Malignant hyperthermia Neg Hx   . Pseudochol deficiency Neg Hx     Social History Social History   Tobacco Use  . Smoking status: Never Smoker  . Smokeless tobacco: Never Used  Substance Use Topics  . Alcohol use: No  . Drug use: No     Allergies   Patient has no known allergies.   Review of Systems Review of Systems  Constitutional: Negative for chills and fever.  HENT: Negative for ear pain and facial swelling.   Respiratory: Negative for shortness of breath, wheezing and stridor.   Cardiovascular: Negative for chest pain.  Gastrointestinal: Negative for nausea and vomiting.  Genitourinary: Negative for decreased urine volume, difficulty urinating, dysuria, flank pain and hematuria.  Musculoskeletal: Positive for back pain, myalgias, neck pain and neck stiffness.  Skin: Negative for color change, pallor, rash and wound.  Neurological: Negative for dizziness, weakness, light-headedness, numbness and headaches.     Physical Exam Updated Vital Signs BP 132/87 (BP Location: Right Arm)   Pulse 72   Temp 98.4 F (36.9 C) (Oral)   Resp 17   Ht  (1.803 m)   Wt 104.4 kg (230 lb 3.2 oz)   SpO2 98%   BMI 32.11 kg/m    Physical Exam  Constitutional: He is oriented to person, place, and time. He appears well-developed and well-nourished. No distress.  Afebrile nontoxic appearing sitting comfortably in the chair in no acute distress  HENT:  Head: Normocephalic.  Eyes: Conjunctivae are normal. Right eye exhibits no discharge. Left eye exhibits no discharge.  Neck:  Midline ttp of cervical spine, reduced rom, tigthness and ttp of musculature  Cardiovascular: Normal rate, regular rhythm, normal heart sounds and intact distal pulses.  Pulmonary/Chest: Effort normal and breath sounds normal. No  stridor. No respiratory distress. He has no wheezes. He has no rales.  Abdominal: He exhibits no distension.  Musculoskeletal: He exhibits tenderness and deformity.  Reduced rom bilaterally with cervical spine rotation. ttp over the trapezius muscle and SCM bilaterally.  Deformity of the right bicep muscle with bulging distally. Full rom at the elbow flexion and extension.  Neurological: He is alert and oriented to person, place, and time. No sensory deficit.  5/5 strength in upper extremities bilaterally. Sensation intact, radial pulses intact. NVI  Skin: Skin is warm and dry. No rash noted. He is not diaphoretic. No erythema. No pallor.  Psychiatric: He has a normal mood and affect. His behavior is normal.  Nursing note and vitals reviewed.    ED Treatments / Results  Labs (all labs ordered are listed, but only abnormal results are displayed) Labs Reviewed - No data to display  EKG None  Radiology Ct Cervical Spine Wo Contrast  Result Date: 05/26/2017 CLINICAL DATA:  Neck pain radiating to the shoulders. EXAM: CT CERVICAL SPINE WITHOUT CONTRAST TECHNIQUE: Multidetector CT imaging of the cervical spine was performed without intravenous contrast. Multiplanar CT image reconstructions were also generated. COMPARISON:  Cervical spine radiograph report from 02/03/2012 FINDINGS: Alignment: Maintained cervical  lordosis. Skull base and vertebrae: No fracture of the skull base. Osteoarthritis of the atlantodental interval. Osteophytes are seen at the craniocervical junction of the clivus, odontoid and anterior atlas. Soft tissues and spinal canal: No prevertebral fluid or swelling. No visible canal hematoma. Disc levels: C2-C3:  Mild central disc bulge.  No foraminal canal stenosis. C3-C4: Broad-based central disc bulge impressing upon the thecal sac with moderate right and mild left foraminal encroachment from uncinate spurring. C4-C5: No disc herniation. Mild left foraminal encroachment from uncinate spurring. C5-C6: Moderate to marked disc flattening with small disc-osteophyte complex. Mild central canal stenosis. No significant foraminal stenosis. C6-C7: No focal disc herniation, significant canal or foraminal stenosis. C7-T1:  Negative Uncinate spurring secondary to osteoarthritis of the uncovertebral joints, right greater than left at C3-4, bilaterally at C4-5 and left greater than right see 5 6. These contribute to variable degrees of foraminal encroachment most marked on the right at C3-4. Upper chest: Negative. Other: None IMPRESSION: 1. No acute cervical spine fracture or static listhesis. 2. Cervical spondylosis with moderate-to-marked disc flattening C3-4 and C5-6 with uncovertebral joint osteoarthritis and variable degrees of foraminal encroachment as above. Canal stenosis C3-4 and C5-6 attributable to degenerative disc disease. Electronically Signed   By: Tollie Eth M.D.   On: 05/26/2017 23:54    Procedures Procedures (including critical care time)  Medications Ordered in ED Medications  HYDROcodone-acetaminophen (NORCO/VICODIN) 5-325 MG per tablet 2 tablet (2 tablets Oral Given 05/26/17 2312)     Initial Impression / Assessment and Plan / ED Course  I have reviewed the triage vital signs and the nursing notes.  Pertinent labs & imaging results that were available during my care of the patient  were reviewed by me and considered in my medical decision making (see chart for details).    Patient presenting with acute on chronic neck injury and torticolis.  CT imaging without acute findings. ttp over the trapezius and SCM bilaterally.  Patient with obvious deformity of the right bicep concerning for bicep tendon rupture of the short head. Patient has rom likely due to long head tendon attachment still intact.   Patient will be discharged home with symptomatic relief and close follow up with ortho, PCP and pain management clinic.  Discussed  return precautions and patient understands and agrees with discharge plan.  Final Clinical Impressions(s) / ED Diagnoses   Final diagnoses:  Torticollis, acute  Injury of tendon of biceps    ED Discharge Orders        Ordered    diclofenac sodium (VOLTAREN) 1 % GEL  4 times daily     05/27/17 0041    Menthol, Topical Analgesic, (BIOFREEZE ROLL-ON) 4 % GEL  3 times daily PRN     05/27/17 0059    methocarbamol (ROBAXIN) 500 MG tablet  At bedtime PRN     05/27/17 0059       Georgiana Shore, PA-C 05/27/17 0535    Georgiana Shore, PA-C 05/27/17 0541    Bethann Berkshire, MD 05/27/17 [phone]

## 2017-07-05 ENCOUNTER — Encounter: Payer: Self-pay | Admitting: Physical Therapy

## 2017-07-05 ENCOUNTER — Ambulatory Visit: Payer: Medicare HMO | Attending: Sports Medicine | Admitting: Physical Therapy

## 2017-07-05 ENCOUNTER — Other Ambulatory Visit: Payer: Self-pay

## 2017-07-05 DIAGNOSIS — M25621 Stiffness of right elbow, not elsewhere classified: Secondary | ICD-10-CM | POA: Diagnosis present

## 2017-07-05 DIAGNOSIS — M25511 Pain in right shoulder: Secondary | ICD-10-CM | POA: Diagnosis present

## 2017-07-05 NOTE — Therapy (Signed)
[facility] [address] [place], [place], [address] Phone: [phone]   Fax:  [phone]  Physical Therapy Evaluation  Patient Details  Name: Austin Pruitt MRN: [id number] Date of Birth: [birth year] Referring Provider: Di Kindle. Brumfield   Encounter Date: 07/05/2017  PT End of Session - 07/05/17 1359    Visit Number  1    Number of Visits  18    Date for PT Re-Evaluation  08/16/17    PT Start Time  1300    PT Stop Time  1347    PT Time Calculation (min)  47 min    Activity Tolerance  Patient tolerated treatment well    Behavior During Therapy  [facility] for tasks assessed/performed       Past Medical History:  Diagnosis Date  . Anxiety   . Arthritis   . Chronic back pain   . Coronary artery disease   . Diabetes mellitus   . GERD (gastroesophageal reflux disease)   . Hypercholesteremia   . Hypertension   . Neuromuscular disorder (HCC)    peripheral neuropathy  . Neuropathic pain of lower extremity     Past Surgical History:  Procedure Laterality Date  . BACK SURGERY     times 3  . BACK SURGERY    . CHOLECYSTECTOMY  10/08/2010   Procedure: LAPAROSCOPIC CHOLECYSTECTOMY;  Surgeon: Dalia Heading;  Location: [facility];  Service: General;  Laterality: N/A;  . COLONOSCOPY  09/01/2010   Procedure: COLONOSCOPY;  Surgeon: Malissa Hippo, MD;  Location: [facility];  Service: Endoscopy;  Laterality: N/A;  . ESOPHAGOGASTRODUODENOSCOPY  09/01/2010   Procedure: ESOPHAGOGASTRODUODENOSCOPY (EGD);  Surgeon: Malissa Hippo, MD;  Location: [facility];  Service: Endoscopy;  Laterality: N/A;  . GANGLION CYST EXCISION  90's   left wrist- [place]  . LUMBAR FUSION     had one in 98 and 2000    There were no vitals filed for this visit.   Subjective Assessment - 07/05/17 2035    Subjective  Patient arrives to physical therapy with reports of R shoulder pain secondary to right proximal biceps tendon repair on 06/15/17. Patient  reported trying to catch a heavy object falling from a cart when he ruptured his biceps tendon. Patient reports having help from wife for ADLs, bathing, and dressing. Patient reports pain at worst is 7/10 with increased activity and laying on side and 2/10 at best with ice and rest. Patient was able to recall some precautions. Patient reports he is no longer required to use sling but to use it in the community to caution community members that he is injured.  Patient's goal is to get back full use of his right arm.    Limitations  Lifting;House hold activities    Patient Stated Goals  full function of my arm    Currently in Pain?  Yes    Pain Score  6     Pain Location  Shoulder    Pain Orientation  Anterior;Right    Pain Descriptors / Indicators  Sore    Pain Type  Surgical pain    Pain Onset  1 to 4 weeks ago    Pain Frequency  Intermittent    Aggravating Factors   increased activity and laying on shoulder    Pain Relieving Factors  rest, ice, some pain medication         [facility] PT Assessment - 07/05/17 0001      Assessment   Medical Diagnosis  Rupture of right proximal biceps tendon    Referring Provider  Di Kindlehristopher S. Brumfield    Onset Date/Surgical Date  06/15/17    Hand Dominance  Right    Next MD Visit  07/26/17    Prior Therapy  no      Precautions   Precautions  Shoulder    Type of Shoulder Precautions  Proximal biceps tendon rupture      Balance Screen   Has the patient fallen in the past 6 months  Yes    How many times?  1    Has the patient had a decrease in activity level because of a fear of falling?   No    Is the patient reluctant to leave their home because of a fear of falling?   No      Home Public house managernvironment   Living Environment  Private residence    Living Arrangements  Spouse/significant other      Prior Function   Level of Independence  Needs assistance with ADLs    Vocation  On disability      ROM / Strength   AROM / PROM / Strength  AROM;Strength;PROM       AROM   AROM Assessment Site  Elbow    Right/Left Elbow  Right    Right Elbow Extension  19      PROM   PROM Assessment Site  Elbow    Right/Left Elbow  Right    [facility]  [facility]      Strength   Overall Strength  Due to precautions                Objective measurements completed on examination: See above findings.      [facility] Adult PT Treatment/Exercise - 07/05/17 0001      Modalities   Modalities  Electrical Stimulation;Vasopneumatic      Programme researcher, broadcasting/film/videolectrical Stimulation   Electrical Stimulation Location  R shoulder    Electrical Stimulation Action  IFC    Electrical Stimulation Parameters  80-150 hz x15 min    Electrical Stimulation Goals  Pain      Vasopneumatic   Number Minutes Vasopneumatic   15 minutes    Vasopnuematic Location   Shoulder    Vasopneumatic Pressure  Low    Vasopneumatic Temperature   34                  PT Long Term Goals - 07/05/17 2045      PT LONG TERM GOAL #1   Title  Patient will be independent with HEP    Time  6    Period  Weeks    Status  New      PT LONG TERM GOAL #2   Title  Patient will achieve full AROM of right elbow in all planes with less than 2/10 pain    Time  6    Period  Weeks    Status  New      PT LONG TERM GOAL #3   Title  Patient will demonstrate 5/5 strength in right elbow to improve stability during functional activities.    Time  6    Period  Weeks    Status  New      PT LONG TERM GOAL #4   Title  Patient will demonstrate full AROM of right shoulder in all planes in order to perform functional activities.    Time  6    Status  New  Plan - 07/05/17 2058    Clinical Impression Statement  Patient is a right handed 57 year old male who presents to physical therapy with right shoulder pain, decreased elbow ROM, WFL PROM supination and pronation, and decreased strength (per protocol). Patient is tender to palpation along anterior aspect of the shoulder. Patient  demonstrated normal sensation upon assessment. Patient's incisions are healing well with residual scabbing. Patient and PT reviewed precautions and protocols; patient reported understanding. Patient would benefit from skilled physical therapy to decrease pain, address deficits and address patient's goals.     History and Personal Factors relevant to plan of care:  DM, HTN    Clinical Presentation  Stable    Clinical Decision Making  Low    Rehab Potential  Excellent    PT Frequency  3x / week    PT Duration  6 weeks    PT Treatment/Interventions  ADLs/Self Care Home Management;Moist Heat;Ultrasound;Cryotherapy;Electrical Stimulation;Vasopneumatic Device;Dry needling;Passive range of motion;Manual techniques;Therapeutic exercise;Therapeutic activities;Neuromuscular re-education;Iontophoresis 4mg /ml Dexamethasone    PT Next Visit Plan  See media for protocol, PROM of elbow and shoulder, modalities PRN.    PT Home Exercise Plan  Scapular retractions, pendulums    Consulted and Agree with Plan of Care  Patient       Patient will benefit from skilled therapeutic intervention in order to improve the following deficits and impairments:  Pain, Decreased strength, Decreased range of motion, Decreased endurance, Decreased activity tolerance, Impaired UE functional use  Visit Diagnosis: Acute pain of right shoulder  Stiffness of right elbow, not elsewhere classified     Problem List There are no active problems to display for this patient.   Guss Bunde, PT, DPT 07/05/2017, 9:05 PM  [facility] [address] [place], [place], [address] Phone: [phone]   Fax:  [phone]  Name: Austin Pruitt MRN: [id number] Date of Birth: 1960/09/08

## 2017-07-06 ENCOUNTER — Ambulatory Visit: Payer: Medicare HMO | Admitting: Physical Therapy

## 2017-07-06 DIAGNOSIS — M25511 Pain in right shoulder: Secondary | ICD-10-CM

## 2017-07-06 DIAGNOSIS — M25621 Stiffness of right elbow, not elsewhere classified: Secondary | ICD-10-CM

## 2017-07-06 NOTE — Patient Instructions (Addendum)
AROM: Wrist Flexion / Extension       With right palm up, bend wrist up. Repeat __10__ times per set. Do _1-3___ sets per session. Do _1-2___ sessions per day.   AROM: Wrist Extension    With right palm down, bend wrist up. Repeat ___10_ times per set. Do ___1-3_ sets per session. Do __1-3__ sessions per day.   AROM: Wrist Radial Deviation    With right thumb up, bend wrist up. Repeat __10__ times per set. Do __1-3__ sets per session. Do __103__ sessions per day.  Copyright  VHI. All rights reserved.  AROM: Forearm Pronation / Supination    With right arm in handshake position, slowly rotate palm down until stretch is felt. Relax. Then rotate palm up until stretch is felt. Repeat _10___ times per set. Do __1__ sets per session. Do _1-3___ sessions per day.  Copyright  VHI. All rights reserved.  PROM: Elbow Flexion / Extension    Grasp right arm at wrist and gently bend elbow as far as possible. Then straighten arm as far as possible. Hold each position __5__ seconds. Repeat _10___ times per set. Do __1-2__ sets per session. Do __-1-3__ sessions per day.

## 2017-07-06 NOTE — Therapy (Addendum)
[facility] [place], [place], [address] Phone: [phone]   Fax:  [phone]  Physical Therapy Treatment  Patient Details  Name: Austin Pruitt MRN: [id number] Date of Birth: 1960-02-08 Referring Provider: Judeth Cornfield. Brumfield   Encounter Date: 07/06/2017  PT End of Session - 07/06/17 1406    Visit Number  2    Number of Visits  18    PT Start Time  2500    PT Stop Time  1428    PT Time Calculation (min)  41 min    Activity Tolerance  Patient tolerated treatment well    Behavior During Therapy  WFL for tasks assessed/performed       Past Medical History:  Diagnosis Date  . Anxiety   . Arthritis   . Chronic back pain   . Coronary artery disease   . Diabetes mellitus   . GERD (gastroesophageal reflux disease)   . Hypercholesteremia   . Hypertension   . Neuromuscular disorder ([place])    peripheral neuropathy  . Neuropathic pain of lower extremity     Past Surgical History:  Procedure Laterality Date  . BACK SURGERY     times 3  . BACK SURGERY    . CHOLECYSTECTOMY  10/08/2010   Procedure: LAPAROSCOPIC CHOLECYSTECTOMY;  Surgeon: Jamesetta So;  Location: [facility];  Service: General;  Laterality: N/A;  . COLONOSCOPY  09/01/2010   Procedure: COLONOSCOPY;  Surgeon: Rogene Houston, MD;  Location: [facility];  Service: Endoscopy;  Laterality: N/A;  . ESOPHAGOGASTRODUODENOSCOPY  09/01/2010   Procedure: ESOPHAGOGASTRODUODENOSCOPY (EGD);  Surgeon: Rogene Houston, MD;  Location: [facility];  Service: Endoscopy;  Laterality: N/A;  . GANGLION CYST EXCISION  90's   left wrist- [place]  . LUMBAR FUSION     had one in 98 and 2000    There were no vitals filed for this visit.  Subjective Assessment - 07/06/17 1351    Subjective  Patient arrived with inreased pain from sleeping on shoulder    Limitations  Lifting;House hold activities    Patient Stated Goals  full function of my arm    Currently in Pain?  Yes     Pain Score  8     Pain Location  Shoulder    Pain Orientation  Right;Anterior    Pain Descriptors / Indicators  Discomfort;Sore    Pain Type  Surgical pain    Pain Onset  1 to 4 weeks ago    Pain Frequency  Intermittent    Aggravating Factors   increased movement or laying on shoulder    Pain Relieving Factors  rest                       [facility] Adult PT Treatment/Exercise - 07/06/17 0001      Exercises   Exercises  Shoulder;Elbow;Hand;Wrist      Shoulder Exercises: Seated   Retraction  Strengthening;Both;20 reps      Hand Exercises   Other Hand Exercises  yellow theraputty x107mn also given for HEP      Wrist Exercises   Wrist Flexion  AROM;Right;Seated 3x10    Wrist Extension  AROM;Right 3x10    Wrist Radial Deviation  AROM;Right 3x10      Electrical Stimulation   Electrical Stimulation Location  R shoulder    Electrical Stimulation Action  IFC    Electrical Stimulation Parameters  80-150hz  x163m    Electrical Stimulation Goals  Pain  Vasopneumatic   Number Minutes Vasopneumatic   15 minutes    Vasopnuematic Location   Shoulder    Vasopneumatic Pressure  Low      Manual Therapy   Manual Therapy  Passive ROM    Manual therapy comments  gentle PROM for right elbow flex/ext, forearm sup/pronation per protocol to improve ROM and function             PT Education - 07/06/17 1426    Education Details  HEP    Person(s) Educated  Patient    Methods  Explanation;Demonstration;Handout    Comprehension  Verbalized understanding;Returned demonstration          PT Long Term Goals - 07/05/17 2045      PT LONG TERM GOAL #1   Title  Patient will be independent with HEP    Time  6    Period  Weeks    Status  New      PT LONG TERM GOAL #2   Title  Patient will achieve full AROM of right elbow in all planes with less than 2/10 pain    Time  6    Period  Weeks    Status  New      PT LONG TERM GOAL #3   Title  Patient will demonstrate 5/5  strength in right elbow to improve stability during functional activities.    Time  6    Period  Weeks    Status  New      PT LONG TERM GOAL #4   Title  Patient will demonstrate full AROM of right shoulder in all planes in order to perform functional activities.    Time  6    Status  New            Plan - 07/06/17 1427    Clinical Impression Statement  Patient tolerated treatment well today. Patient has good understanding of protocol and limitations. HEP given today for HEP progression. Patient able to complete all movement s with no difficulty or discomfort. Patient felt better after treatment. Goals ongoing.    Rehab Potential  Excellent    Clinical Impairments Affecting Rehab Potential  Surgery 06/15/17 current 3 weeks 07/06/17    PT Frequency  3x / week    PT Duration  6 weeks    PT Treatment/Interventions  ADLs/Self Care Home Management;Moist Heat;Ultrasound;Cryotherapy;Electrical Stimulation;Vasopneumatic Device;Dry needling;Passive range of motion;Manual techniques;Therapeutic exercise;Therapeutic activities;Neuromuscular re-education;Iontophoresis 62m/ml Dexamethasone    PT Next Visit Plan  See media for protocol, PROM of elbow and shoulder, modalities PRN.    Consulted and Agree with Plan of Care  Patient       Patient will benefit from skilled therapeutic intervention in order to improve the following deficits and impairments:  Pain, Decreased strength, Decreased range of motion, Decreased endurance, Decreased activity tolerance, Impaired UE functional use  Visit Diagnosis: Acute pain of right shoulder  Stiffness of right elbow, not elsewhere classified     Problem List There are no active problems to display for this patient.  PHYSICAL THERAPY DISCHARGE SUMMARY  Visits from Start of Care: 2  Current functional level related to goals / functional outcomes: See above   Remaining deficits: See goals   Education / Equipment: HEP Plan: Patient agrees to  discharge.  Patient goals were not met. Patient is being discharged due to not returning since the last visit.  ?????        Colen Eltzroth P, PTA 07/06/2017, 2:32 PM  [facility] [place], [place], [address] Phone: [phone]   Fax:  [phone]  Name: JACON WHETZEL MRN: [id number] Date of Birth: 1960-11-03

## 2017-07-20 ENCOUNTER — Ambulatory Visit: Payer: Medicare HMO | Admitting: Physical Therapy

## 2018-11-07 IMAGING — CT CT CERVICAL SPINE W/O CM
3 of 4 series · 10 of 33 positions shown, 12 images · non-contrast
Comparison: Cervical spine radiograph report from 02/03/2012

CLINICAL DATA: Neck pain radiating to the shoulders.

EXAM:
CT CERVICAL SPINE WITHOUT CONTRAST
TECHNIQUE: Multidetector CT imaging of the cervical spine was performed without
intravenous contrast. Multiplanar CT image reconstructions were also
generated.

[Series 5: sagittal bone · sagittal · 0.22mm/px · 5 of 61 slices shown, 6 images]
[im 21/61  bone]
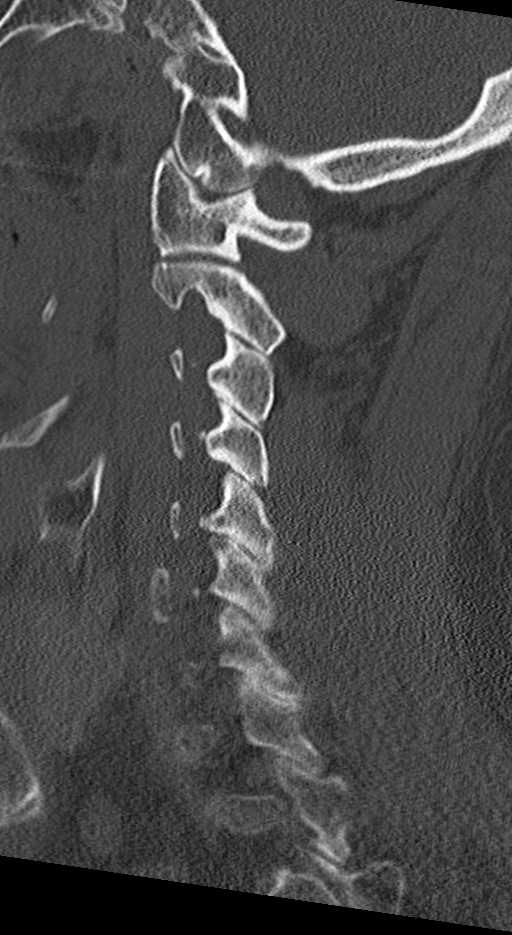
[im 26/61  bone]
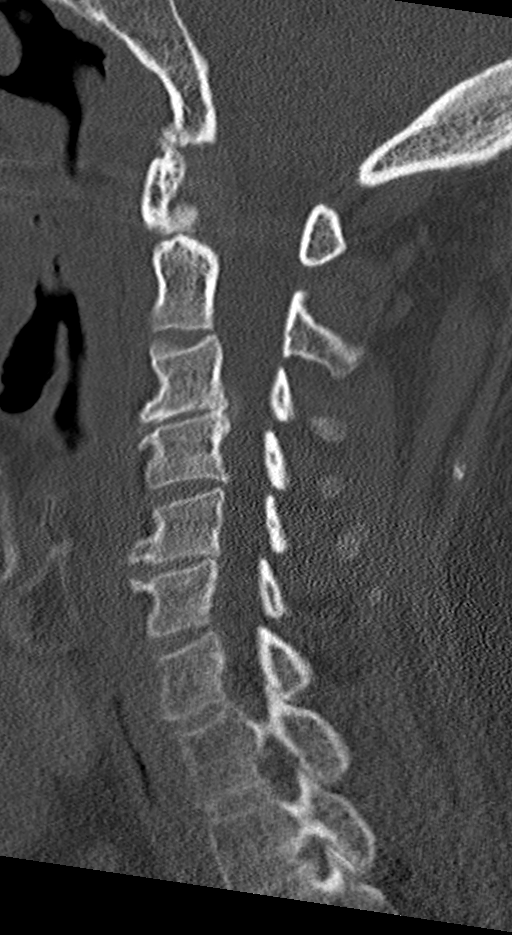
[im 31/61  soft-tissue]
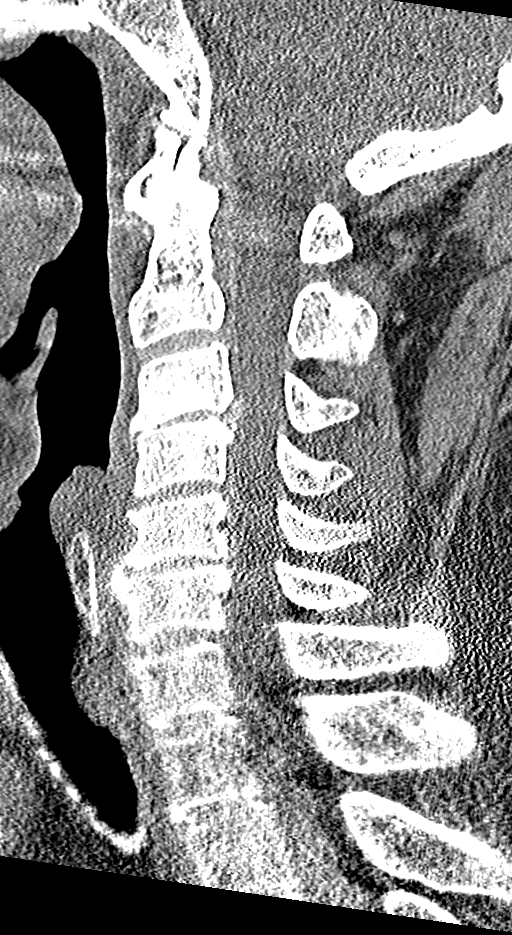
[im 31/61  bone]
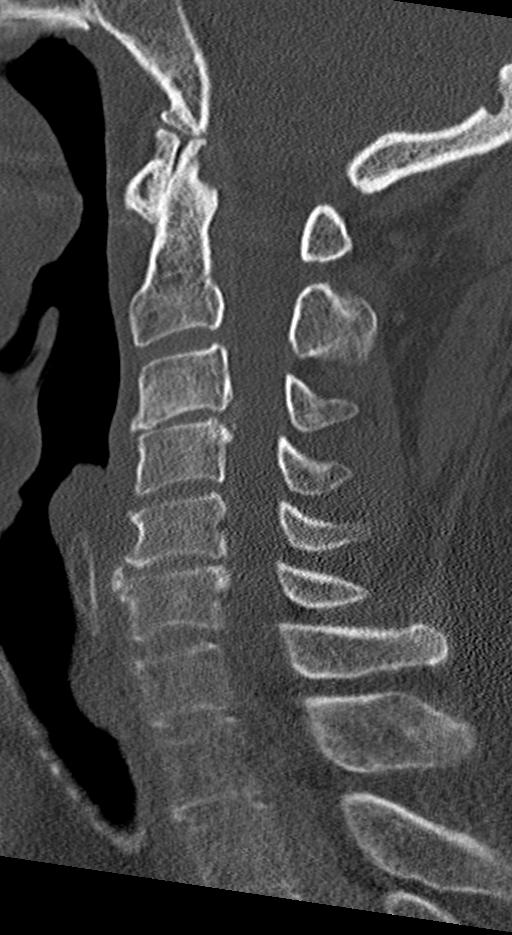
[im 36/61  bone]
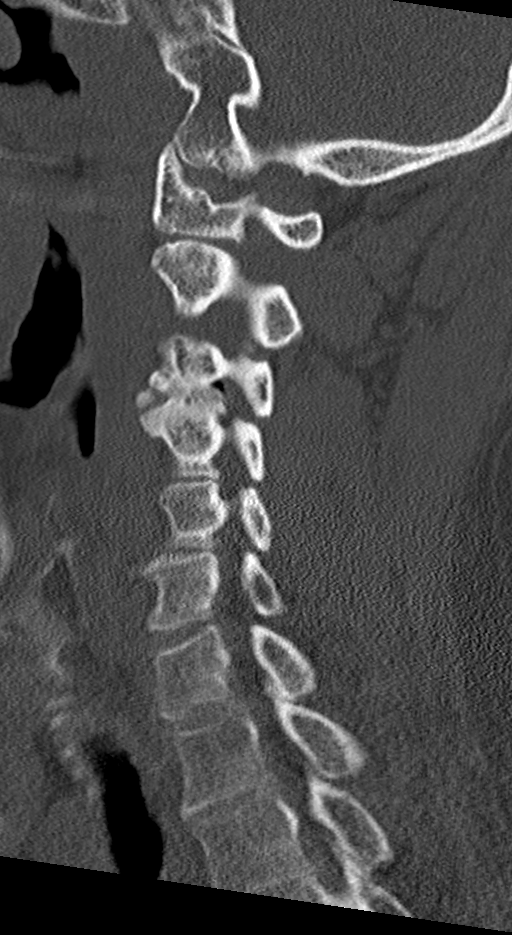
[im 41/61  bone]
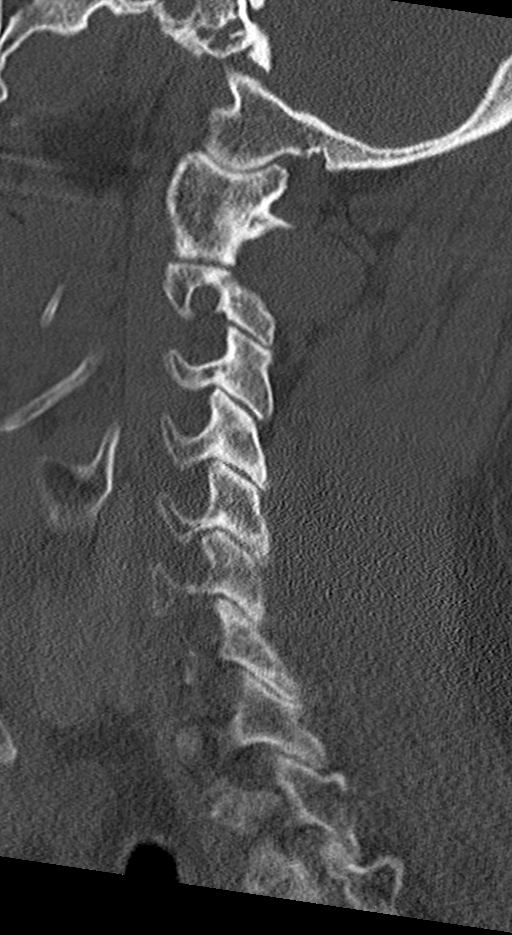

[Series 6: coronal bone · coronal · 0.25mm/px · 3 of 61 slices shown]
[im 13/61  bone]
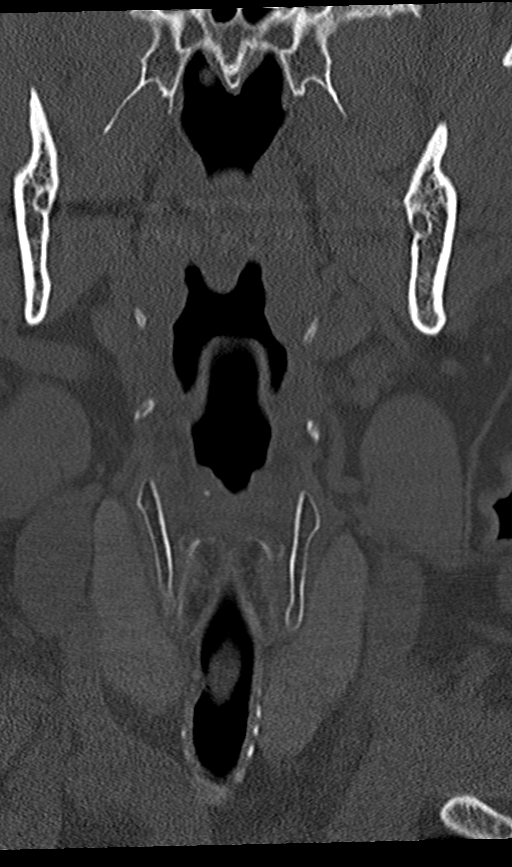
[im 25/61  bone]
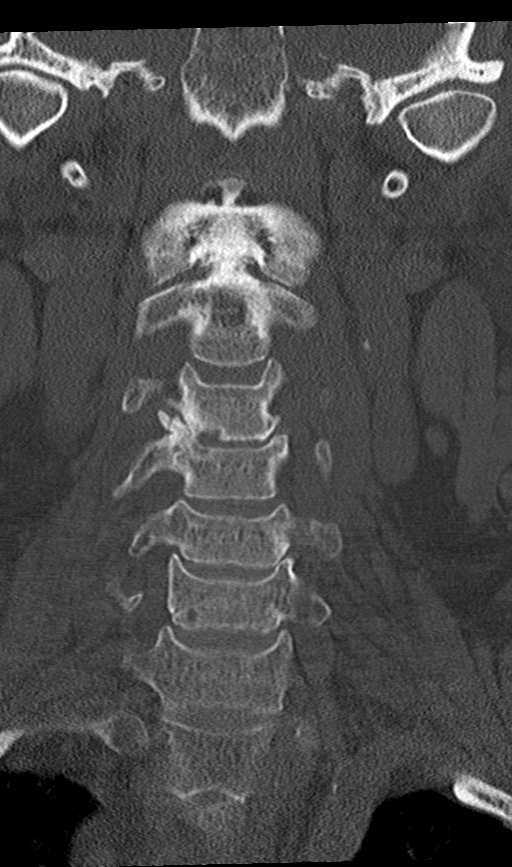
[im 37/61  bone]
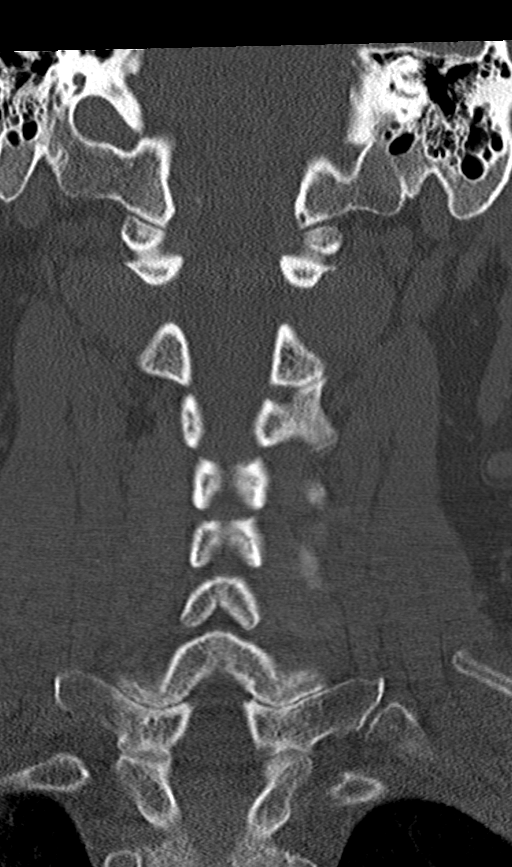

[Series 7: orthogonal bone · axial · 0.21mm/px · z∈[+1306,+1367]mm · 2 of 94 slices shown, 3 images]
[im 32/94  soft-tissue]
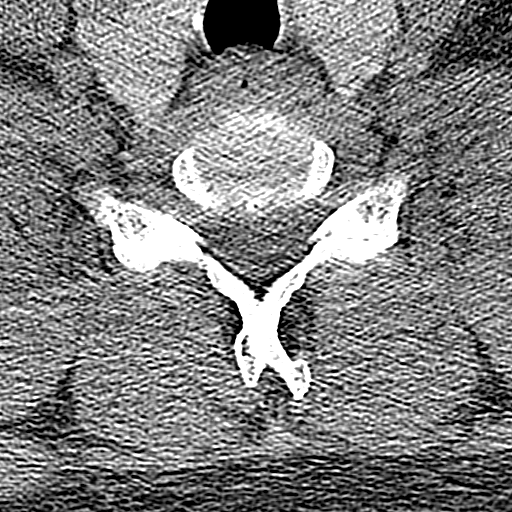
[im 32/94  bone]
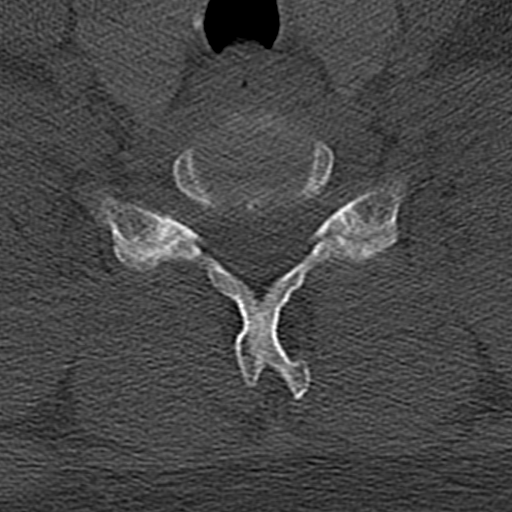
[im 63/94  bone]
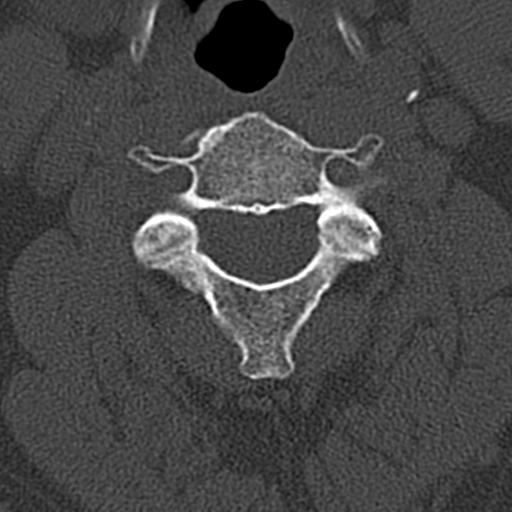

[10 of 33 positions shown; findings below may reference images not displayed]

FINDINGS: Alignment: Maintained cervical lordosis.

Skull base and vertebrae: No fracture of the skull base.
Osteoarthritis of the atlantodental interval. Osteophytes are seen
at the craniocervical junction of the clivus, odontoid and anterior
atlas.

Soft tissues and spinal canal: No prevertebral fluid or swelling. No
visible canal hematoma.

Disc levels:

C2-C3:  Mild central disc bulge.  No foraminal canal stenosis.

C3-C4: Broad-based central disc bulge impressing upon the thecal sac
with moderate right and mild left foraminal encroachment from
uncinate spurring.

C4-C5: No disc herniation. Mild left foraminal encroachment from
uncinate spurring.

C5-C6: Moderate to marked disc flattening with small disc-osteophyte
complex. Mild central canal stenosis. No significant foraminal
stenosis.

C6-C7: No focal disc herniation, significant canal or foraminal
stenosis.

C7-T1:  Negative

Uncinate spurring secondary to osteoarthritis of the uncovertebral
joints, right greater than left at C3-4, bilaterally at C4-5 and
left greater than right see 5 6. These contribute to variable
degrees of foraminal encroachment most marked on the right at C3-4.

Upper chest: Negative.

Other: None
IMPRESSION: 1. No acute cervical spine fracture or static listhesis.
2. Cervical spondylosis with moderate-to-marked disc flattening C3-4
and C5-6 with uncovertebral joint osteoarthritis and variable
degrees of foraminal encroachment as above. Canal stenosis C3-4 and
C5-6 attributable to degenerative disc disease.

## 2021-11-29 ENCOUNTER — Encounter (INDEPENDENT_AMBULATORY_CARE_PROVIDER_SITE_OTHER): Payer: Self-pay | Admitting: *Deleted

## 2021-12-10 ENCOUNTER — Encounter (INDEPENDENT_AMBULATORY_CARE_PROVIDER_SITE_OTHER): Payer: Self-pay | Admitting: *Deleted

## 2022-03-03 ENCOUNTER — Ambulatory Visit (INDEPENDENT_AMBULATORY_CARE_PROVIDER_SITE_OTHER): Payer: Medicare HMO | Admitting: Gastroenterology

## 2022-03-04 ENCOUNTER — Encounter (INDEPENDENT_AMBULATORY_CARE_PROVIDER_SITE_OTHER): Payer: Self-pay | Admitting: *Deleted
